# Patient Record
Sex: Male | Born: 1965 | Race: White | Hispanic: No | Marital: Married | Smoking: Never smoker
Health system: Southern US, Community
[De-identification: ages and names within clinical notes are randomized; demographics above are authoritative.]

## PROBLEM LIST (undated history)

## (undated) DIAGNOSIS — E079 Disorder of thyroid, unspecified: Secondary | ICD-10-CM

## (undated) DIAGNOSIS — R7989 Other specified abnormal findings of blood chemistry: Secondary | ICD-10-CM

## (undated) DIAGNOSIS — I10 Essential (primary) hypertension: Secondary | ICD-10-CM

## (undated) DIAGNOSIS — M109 Gout, unspecified: Secondary | ICD-10-CM

---

## 2002-03-27 HISTORY — PX: KNEE ARTHROSCOPY: SUR90

## 2010-12-04 ENCOUNTER — Emergency Department (HOSPITAL_COMMUNITY): Payer: Worker's Compensation

## 2010-12-04 ENCOUNTER — Emergency Department (HOSPITAL_COMMUNITY)
Admission: EM | Admit: 2010-12-04 | Discharge: 2010-12-04 | Disposition: A | Discharge status: Home / Self Care | Payer: Worker's Compensation | Attending: Emergency Medicine | Admitting: Emergency Medicine

## 2010-12-04 DIAGNOSIS — I10 Essential (primary) hypertension: Secondary | ICD-10-CM | POA: Insufficient documentation

## 2010-12-04 DIAGNOSIS — X500XXA Overexertion from strenuous movement or load, initial encounter: Secondary | ICD-10-CM | POA: Insufficient documentation

## 2010-12-04 DIAGNOSIS — S335XXA Sprain of ligaments of lumbar spine, initial encounter: Secondary | ICD-10-CM | POA: Insufficient documentation

## 2010-12-04 DIAGNOSIS — Z794 Long term (current) use of insulin: Secondary | ICD-10-CM | POA: Insufficient documentation

## 2010-12-04 DIAGNOSIS — E119 Type 2 diabetes mellitus without complications: Secondary | ICD-10-CM | POA: Insufficient documentation

## 2010-12-04 DIAGNOSIS — M545 Low back pain, unspecified: Secondary | ICD-10-CM | POA: Insufficient documentation

## 2010-12-04 DIAGNOSIS — M79609 Pain in unspecified limb: Secondary | ICD-10-CM | POA: Insufficient documentation

## 2010-12-04 DIAGNOSIS — Y9289 Other specified places as the place of occurrence of the external cause: Secondary | ICD-10-CM | POA: Insufficient documentation

## 2011-10-13 ENCOUNTER — Emergency Department (INDEPENDENT_AMBULATORY_CARE_PROVIDER_SITE_OTHER)
Admission: EM | Admit: 2011-10-13 | Discharge: 2011-10-13 | Disposition: A | Discharge status: Home / Self Care | Payer: Self-pay | Source: Home / Self Care | Attending: Emergency Medicine | Admitting: Emergency Medicine

## 2011-10-13 ENCOUNTER — Encounter (HOSPITAL_COMMUNITY): Payer: Self-pay | Admitting: Emergency Medicine

## 2011-10-13 DIAGNOSIS — S91339A Puncture wound without foreign body, unspecified foot, initial encounter: Secondary | ICD-10-CM

## 2011-10-13 DIAGNOSIS — S91309A Unspecified open wound, unspecified foot, initial encounter: Secondary | ICD-10-CM

## 2011-10-13 HISTORY — DX: Essential (primary) hypertension: I10

## 2011-10-13 HISTORY — DX: Other specified abnormal findings of blood chemistry: R79.89

## 2011-10-13 HISTORY — DX: Disorder of thyroid, unspecified: E07.9

## 2011-10-13 MED ORDER — TETANUS-DIPHTH-ACELL PERTUSSIS 5-2.5-18.5 LF-MCG/0.5 IM SUSP
0.5000 mL | Freq: Once | INTRAMUSCULAR | Status: AC
  Administered 2011-10-13: 0.5 mL via INTRAMUSCULAR

## 2011-10-13 MED ORDER — TETANUS-DIPHTH-ACELL PERTUSSIS 5-2.5-18.5 LF-MCG/0.5 IM SUSP
INTRAMUSCULAR | Status: AC
  Filled 2011-10-13: qty 0.5

## 2011-10-13 NOTE — ED Provider Notes (Signed)
Medical screening examination/treatment/procedure(s) were performed by non-physician practitioner and as supervising physician I was immediately available for consultation/collaboration.  Leslee Home, M.D.   Reuben Likes, MD 10/13/11 2101

## 2011-10-13 NOTE — ED Provider Notes (Signed)
History     CSN: [id number]  Arrival date & time 10/13/11  1810   First MD Initiated Contact with Patient 10/13/11 1949      Chief Complaint  Patient presents with  . Foot Injury    (Consider location/radiation/quality/duration/timing/severity/associated sxs/prior treatment) HPI Comments: At a restaurant, stepped on a long tack used to attached feet to a chair, going through flip flop and puncturing R foot.  Reports throbbing pain at first, applied ice, no further pain in the area.  Does have peripheral neuropathy from diabetes so usually doesn't have good sensation in his foot.  Also taking keflex, 10 day course started yesterday, to treat B cellulitis pt got when trimming his own toenails.    Patient is a 46 y.o. male presenting with foot injury. The history is provided by the patient.  Foot Injury  The incident occurred 1 to 2 hours ago. Incident location: at a restaurant. Injury mechanism: stepped on a sharp, long tack  The pain is present in the right foot. The quality of the pain is described as throbbing. The pain is moderate. The pain has been improving since onset. Associated symptoms include numbness. He reports no foreign bodies present. Nothing aggravates the symptoms. He has tried ice for the symptoms. The treatment provided significant relief.    Past Medical History  Diagnosis Date  . Diabetes mellitus   . Hypertension   . Thyroid disease   . Low testosterone     Past Surgical History  Procedure Date  . Knee arthroscopy 2004    History reviewed. No pertinent family history.  History  Substance Use Topics  . Smoking status: Never Smoker   . Smokeless tobacco: Current User    Types: Snuff  . Alcohol Use: No      Review of Systems  Constitutional: Negative for fever and chills.  Musculoskeletal: Negative for gait problem.       Foot pain resolved  Skin: Positive for wound.  Neurological: Positive for numbness.       Normally has loss of sensation in  his feet due to neuropathy, no change from usual    Allergies  Review of patient's allergies indicates no known allergies.  Home Medications   Current Outpatient Rx  Name Route Sig Dispense Refill  . AMLODIPINE BESYLATE 10 MG PO TABS Oral Take 10 mg by mouth daily.    . CEPHALEXIN 500 MG PO CAPS Oral Take 500 mg by mouth 3 (three) times daily.    . IMIPRAMINE HCL 50 MG PO TABS Oral Take 50 mg by mouth at bedtime.    . INSULIN ASPART 100 UNIT/ML [place] SOLN Subcutaneous Inject 45 Units into the skin 3 (three) times daily before meals.    Marland Kitchen LEVOTHYROXINE SODIUM 150 MCG PO TABS Oral Take 150 mcg by mouth daily.    Marland Kitchen VICTOZA [place] Subcutaneous Inject into the skin.    Marland Kitchen LOSARTAN POTASSIUM 100 MG PO TABS Oral Take 100 mg by mouth daily.    Marland Kitchen LOVASTATIN 20 MG PO TABS Oral Take 20 mg by mouth.    . METFORMIN HCL 1000 MG PO TABS Oral Take 1,000 mg by mouth 2 (two) times daily with a meal.    . TESTOSTERONE ENANTHATE 200 MG/ML IM OIL Intramuscular Inject 200 mg into the muscle every 14 (fourteen) days. For IM use only      BP 151/84  Pulse 81  Temp 98.2 F (36.8 C) (Oral)  Resp 16  SpO2 94%  Physical Exam  Constitutional: He appears well-developed and well-nourished. No distress.  Pulmonary/Chest: Effort normal.  Skin: Skin is warm and dry.          B great toes with open wounds near lateral toenail, mild surrounding erythema c/w cellulitis.     ED Course  Procedures (including critical care time)  Labs Reviewed - No data to display No results found.   1. Puncture wound of foot       MDM  Pt already on keflex. To monitor puncture wound carefully for signs infection.  Tetanus updated.         Cathlyn Parsons, NP 10/13/11 2029

## 2011-10-13 NOTE — ED Notes (Signed)
Pt stated that he noticed a sharp pain in left foot and looked at his foot and saw that he stepped on the sharp bottom part of a chair that goes on the legs of the chair. Pt stated that he is unsure of how long the tac has been in there but noticed it at 17:30. Pt currently on Keflex for bilateral great toe infection. Pt stated that left foot was throbbing when he arrived but at the moment does not feel any pain.

## 2011-11-01 ENCOUNTER — Encounter (HOSPITAL_COMMUNITY): Payer: Self-pay | Admitting: *Deleted

## 2011-11-01 ENCOUNTER — Emergency Department (HOSPITAL_COMMUNITY): Payer: Commercial Indemnity

## 2011-11-01 ENCOUNTER — Inpatient Hospital Stay (HOSPITAL_COMMUNITY)
Admission: EM | Admit: 2011-11-01 | Discharge: 2011-11-02 | DRG: 310 | Disposition: A | Discharge status: Home / Self Care | Payer: Commercial Indemnity | Attending: Internal Medicine | Admitting: Internal Medicine

## 2011-11-01 DIAGNOSIS — E119 Type 2 diabetes mellitus without complications: Secondary | ICD-10-CM | POA: Diagnosis present

## 2011-11-01 DIAGNOSIS — I4891 Unspecified atrial fibrillation: Principal | ICD-10-CM | POA: Diagnosis present

## 2011-11-01 DIAGNOSIS — E039 Hypothyroidism, unspecified: Secondary | ICD-10-CM

## 2011-11-01 DIAGNOSIS — Z79899 Other long term (current) drug therapy: Secondary | ICD-10-CM

## 2011-11-01 DIAGNOSIS — I10 Essential (primary) hypertension: Secondary | ICD-10-CM | POA: Diagnosis present

## 2011-11-01 DIAGNOSIS — R079 Chest pain, unspecified: Secondary | ICD-10-CM

## 2011-11-01 DIAGNOSIS — E669 Obesity, unspecified: Secondary | ICD-10-CM | POA: Diagnosis present

## 2011-11-01 DIAGNOSIS — M109 Gout, unspecified: Secondary | ICD-10-CM | POA: Diagnosis present

## 2011-11-01 DIAGNOSIS — F17200 Nicotine dependence, unspecified, uncomplicated: Secondary | ICD-10-CM | POA: Diagnosis present

## 2011-11-01 DIAGNOSIS — I517 Cardiomegaly: Secondary | ICD-10-CM

## 2011-11-01 DIAGNOSIS — Z794 Long term (current) use of insulin: Secondary | ICD-10-CM

## 2011-11-01 HISTORY — DX: Gout, unspecified: M10.9

## 2011-11-01 LAB — GLUCOSE, CAPILLARY
Glucose-Capillary: 143 mg/dL — ABNORMAL HIGH (ref 70–99)
Glucose-Capillary: 179 mg/dL — ABNORMAL HIGH (ref 70–99)

## 2011-11-01 LAB — CARDIAC PANEL(CRET KIN+CKTOT+MB+TROPI)
CK, MB: 5.2 ng/mL — ABNORMAL HIGH (ref 0.3–4.0)
Relative Index: 2.7 — ABNORMAL HIGH (ref 0.0–2.5)
Relative Index: 3.3 — ABNORMAL HIGH (ref 0.0–2.5)
Relative Index: 3.7 — ABNORMAL HIGH (ref 0.0–2.5)
Total CK: 250 U/L — ABNORMAL HIGH (ref 7–232)
Troponin I: <0.3 ng/mL (ref <0.30)
Troponin I: <0.3 ng/mL (ref <0.30)

## 2011-11-01 LAB — BASIC METABOLIC PANEL
BUN: 13 mg/dL (ref 6–23)
Creatinine, Ser: 1.06 mg/dL (ref 0.50–1.35)
GFR calc Af Amer: >90 mL/min (ref >90)
GFR calc non Af Amer: 82 mL/min — ABNORMAL LOW (ref >90)
Glucose, Bld: 186 mg/dL — ABNORMAL HIGH (ref 70–99)

## 2011-11-01 LAB — PROTIME-INR: INR: 0.94 (ref 0.00–1.49)

## 2011-11-01 LAB — CBC
HCT: 46.9 % (ref 39.0–52.0)
Hemoglobin: 17.2 g/dL — ABNORMAL HIGH (ref 13.0–17.0)
MCH: 32.3 pg (ref 26.0–34.0)
MCHC: 36.7 g/dL — ABNORMAL HIGH (ref 30.0–36.0)
MCV: 88 fL (ref 78.0–100.0)
RDW: 12.5 % (ref 11.5–15.5)

## 2011-11-01 LAB — TSH: TSH: 5.751 u[IU]/mL — ABNORMAL HIGH (ref 0.350–4.500)

## 2011-11-01 MED ORDER — HEPARIN BOLUS VIA INFUSION
4000.0000 [IU] | Freq: Once | INTRAVENOUS | Status: AC
  Administered 2011-11-01: 4000 [IU] via INTRAVENOUS

## 2011-11-01 MED ORDER — ASPIRIN EC 81 MG PO TBEC
81.0000 mg | DELAYED_RELEASE_TABLET | Freq: Every day | ORAL | Status: DC
  Administered 2011-11-01 – 2011-11-02 (×2): 81 mg via ORAL
  Filled 2011-11-01 (×2): qty 1

## 2011-11-01 MED ORDER — INSULIN ASPART 100 UNIT/ML ~~LOC~~ SOLN
0.0000 [IU] | Freq: Three times a day (TID) | SUBCUTANEOUS | Status: DC
  Administered 2011-11-01: 4 [IU] via SUBCUTANEOUS
  Administered 2011-11-01: 3 [IU] via SUBCUTANEOUS
  Administered 2011-11-01: 4 [IU] via SUBCUTANEOUS

## 2011-11-01 MED ORDER — ONDANSETRON HCL 4 MG/2ML IJ SOLN: 4.0000 mg | Freq: Four times a day (QID) | INTRAMUSCULAR | Status: DC | PRN

## 2011-11-01 MED ORDER — LOSARTAN POTASSIUM 50 MG PO TABS
100.0000 mg | ORAL_TABLET | Freq: Every day | ORAL | Status: DC
Start: 2011-11-01 — End: 2011-11-02
  Administered 2011-11-01 – 2011-11-02 (×2): 100 mg via ORAL
  Filled 2011-11-01 (×2): qty 2

## 2011-11-01 MED ORDER — SODIUM CHLORIDE 0.9 % IJ SOLN: 3.0000 mL | Freq: Two times a day (BID) | INTRAMUSCULAR | Status: DC

## 2011-11-01 MED ORDER — HEPARIN (PORCINE) IN NACL 100-0.45 UNIT/ML-% IJ SOLN
1700.0000 [IU]/h | INTRAMUSCULAR | Status: DC
  Administered 2011-11-01: 1000 [IU]/h via INTRAVENOUS
  Administered 2011-11-01: 1700 [IU]/h via INTRAVENOUS
  Filled 2011-11-01 (×2): qty 250

## 2011-11-01 MED ORDER — SIMVASTATIN 10 MG PO TABS
10.0000 mg | ORAL_TABLET | Freq: Every day | ORAL | Status: DC
  Administered 2011-11-01: 10 mg via ORAL
  Filled 2011-11-01 (×2): qty 1

## 2011-11-01 MED ORDER — ASPIRIN 81 MG PO CHEW
324.0000 mg | CHEWABLE_TABLET | Freq: Once | ORAL | Status: AC
  Administered 2011-11-01: 324 mg via ORAL
  Filled 2011-11-01: qty 4

## 2011-11-01 MED ORDER — AMLODIPINE BESYLATE 10 MG PO TABS
10.0000 mg | ORAL_TABLET | Freq: Every day | ORAL | Status: DC
  Administered 2011-11-01 – 2011-11-02 (×2): 10 mg via ORAL
  Filled 2011-11-01 (×2): qty 1

## 2011-11-01 MED ORDER — INSULIN ASPART 100 UNIT/ML ~~LOC~~ SOLN
45.0000 [IU] | Freq: Three times a day (TID) | SUBCUTANEOUS | Status: DC
  Administered 2011-11-01 – 2011-11-02 (×4): 45 [IU] via SUBCUTANEOUS

## 2011-11-01 MED ORDER — INSULIN ASPART 100 UNIT/ML ~~LOC~~ SOLN: 0.0000 [IU] | Freq: Every day | SUBCUTANEOUS | Status: DC

## 2011-11-01 MED ORDER — IMIPRAMINE HCL 50 MG PO TABS
50.0000 mg | ORAL_TABLET | Freq: Every day | ORAL | Status: DC
  Administered 2011-11-01: 50 mg via ORAL
  Filled 2011-11-01 (×2): qty 1

## 2011-11-01 MED ORDER — HEPARIN BOLUS VIA INFUSION
3000.0000 [IU] | Freq: Once | INTRAVENOUS | Status: AC
  Administered 2011-11-01: 3000 [IU] via INTRAVENOUS
  Filled 2011-11-01: qty 3000

## 2011-11-01 MED ORDER — MORPHINE SULFATE 2 MG/ML IJ SOLN: 2.0000 mg | INTRAMUSCULAR | Status: DC | PRN

## 2011-11-01 MED ORDER — HEPARIN (PORCINE) IN NACL 100-0.45 UNIT/ML-% IJ SOLN
2950.0000 [IU]/h | INTRAMUSCULAR | Status: DC
  Administered 2011-11-01 (×2): 2050 [IU]/h via INTRAVENOUS
  Administered 2011-11-02: 2500 [IU]/h via INTRAVENOUS
  Filled 2011-11-01 (×4): qty 250

## 2011-11-01 MED ORDER — METOPROLOL TARTRATE 25 MG PO TABS
25.0000 mg | ORAL_TABLET | Freq: Two times a day (BID) | ORAL | Status: DC
  Administered 2011-11-01 – 2011-11-02 (×3): 25 mg via ORAL
  Filled 2011-11-01 (×5): qty 1

## 2011-11-01 MED ORDER — SODIUM CHLORIDE 0.9 % IV SOLN
INTRAVENOUS | Status: DC
  Administered 2011-11-01 – 2011-11-02 (×2): via INTRAVENOUS

## 2011-11-01 MED ORDER — METFORMIN HCL 500 MG PO TABS
1000.0000 mg | ORAL_TABLET | Freq: Two times a day (BID) | ORAL | Status: DC
  Administered 2011-11-01 – 2011-11-02 (×3): 1000 mg via ORAL
  Filled 2011-11-01 (×5): qty 2

## 2011-11-01 MED ORDER — ONDANSETRON HCL 4 MG PO TABS: 4.0000 mg | ORAL_TABLET | Freq: Four times a day (QID) | ORAL | Status: DC | PRN

## 2011-11-01 MED ORDER — LEVOTHYROXINE SODIUM 150 MCG PO TABS
150.0000 ug | ORAL_TABLET | Freq: Every day | ORAL | Status: DC
  Administered 2011-11-01 – 2011-11-02 (×2): 150 ug via ORAL
  Filled 2011-11-01 (×3): qty 1

## 2011-11-01 NOTE — ED Notes (Signed)
REPORT GIVEN TO FLOOR NURSE, TRANSPORTED BY FLOAT NURSE IN STABLE CONDITION , NO PAIN , RESPIRATIONS UNLABORED , HEPARIN DRIP INFUSING , IV SITE UNREMARKABLE.

## 2011-11-01 NOTE — Progress Notes (Signed)
ANTICOAGULATION CONSULT NOTE - Initial Consult  Pharmacy Consult for heparin Indication: atrial fibrillation  No Known Allergies  Patient Measurements: Height: 6\' 3"  (190.5 cm) Weight: 292 lb 8 oz (132.677 kg) IBW/kg (Calculated) : 84.5  Heparin Dosing Weight: 114 kg   Vital Signs: Temp: 98.1 F (36.7 C) (08/07 0535) Temp src: Oral (08/07 0535) BP: 170/92 mmHg (08/07 0535) Pulse Rate: 89  (08/07 0535)  Labs:  Alvira Philips 11/01/11 0208  HGB 17.2*  HCT 46.9  PLT 292  APTT --  LABPROT --  INR --  HEPARINUNFRC --  CREATININE 1.06  CKTOTAL --  CKMB --  TROPONINI --    Estimated Creatinine Clearance: 126.5 ml/min (by C-G formula based on Cr of 1.06).   Medical History: Past Medical History  Diagnosis Date  . Diabetes mellitus   . Hypertension   . Thyroid disease   . Low testosterone   . Gout   3  Medications:  Scheduled:    . amLODipine  10 mg Oral Daily  . aspirin  324 mg Oral Once  . aspirin EC  81 mg Oral Daily  . heparin  4,000 Units Intravenous Once  . imipramine  50 mg Oral QHS  . insulin aspart  0-20 Units Subcutaneous TID WC  . insulin aspart  0-5 Units Subcutaneous QHS  . insulin aspart  45 Units Subcutaneous TID AC  . levothyroxine  150 mcg Oral QAC breakfast  . losartan  100 mg Oral Daily  . metFORMIN  1,000 mg Oral BID WC  . simvastatin  10 mg Oral q1800  . sodium chloride  3 mL Intravenous Q12H    Assessment: 46 yo male admitted with atrial fibrillation. Heparin started in ED and is infusing at 1000 units/hr.   Goal of Therapy:  Heparin level 0.3-0.7 units/ml Monitor platelets by anticoagulation protocol: Yes   Plan:  1. Increase IV heparin to 1700 units/hr.  2. Heparin level in 6 hours.  3. Daily CBC, heparin level  Emeline Gins 11/01/2011,7:28 AM

## 2011-11-01 NOTE — Progress Notes (Signed)
Patient seen and examined by me- see H&P by Dr. Conley Rolls from this AM.  Cardiology consulted for new onset a fib. Will start metoprolol for better BP control.  Rate does not seem to be an issue right now.  Marlin Canary

## 2011-11-01 NOTE — Progress Notes (Signed)
Heparin bolus given of 3000 units and rate increased to 20.5 ml/hr.

## 2011-11-01 NOTE — ED Notes (Signed)
PHARMACY NOTIFIED ON PT'S HEPARIN IV DRIP ORDER.

## 2011-11-01 NOTE — ED Provider Notes (Signed)
History     CSN: [id number]  Arrival date & time 11/01/11  0200   First MD Initiated Contact with Patient 11/01/11 0246      Chief Complaint  Patient presents with  . Chest Pain    (Consider location/radiation/quality/duration/timing/severity/associated sxs/prior treatment) HPI History provided by patient. Had some are not dizziness at work yesterday. Tonight developed shortness of breath with substernal chest pain and tightness. Symptoms relieved when he received oxygen in the emergency room. He is currently asymptomatic. No known aggravating or alleviating symptoms otherwise. Has history of diabetes hypertension thyroid disease - no recent change in medications. Pain was 3/10 in severity at its worse. No leg pain or leg swelling. No fevers or chills. No recent illness. Drinks caffeine daily. Nonsmoker. Occasional alcohol use. No history of atrial fibrillation.   Past Medical History  Diagnosis Date  . Diabetes mellitus   . Hypertension   . Thyroid disease   . Low testosterone   . Gout     Past Surgical History  Procedure Date  . Knee arthroscopy 2004    History reviewed. No pertinent family history.  History  Substance Use Topics  . Smoking status: Never Smoker   . Smokeless tobacco: Current User    Types: Snuff  . Alcohol Use: Yes     occasionally      Review of Systems  Constitutional: Negative for fever and chills.  HENT: Negative for neck pain and neck stiffness.   Eyes: Negative for pain.  Respiratory: Positive for shortness of breath.   Cardiovascular: Positive for chest pain.  Gastrointestinal: Negative for abdominal pain.  Genitourinary: Negative for dysuria.  Musculoskeletal: Negative for back pain.  Skin: Negative for rash.  Neurological: Negative for headaches.  All other systems reviewed and are negative.    Allergies  Review of patient's allergies indicates no known allergies.  Home Medications   Current Outpatient Rx  Name Route Sig  Dispense Refill  . AMLODIPINE BESYLATE 10 MG PO TABS Oral Take 10 mg by mouth daily.    . IMIPRAMINE HCL 50 MG PO TABS Oral Take 50 mg by mouth at bedtime.     . INSULIN ASPART 100 UNIT/ML [place] SOLN Subcutaneous Inject 45 Units into the skin 3 (three) times daily before meals.     Marland Kitchen LEVOTHYROXINE SODIUM 150 MCG PO TABS Oral Take 150 mcg by mouth daily.    Marland Kitchen VICTOZA [place] Subcutaneous Inject into the skin every morning.     Marland Kitchen LOSARTAN POTASSIUM 100 MG PO TABS Oral Take 100 mg by mouth daily.    Marland Kitchen LOVASTATIN 20 MG PO TABS Oral Take 20 mg by mouth.    . METFORMIN HCL 1000 MG PO TABS Oral Take 1,000 mg by mouth 2 (two) times daily with a meal.    . TESTOSTERONE ENANTHATE 200 MG/ML IM OIL Intramuscular Inject 200 mg into the muscle every 14 (fourteen) days. For IM use only      BP 151/80  Pulse 93  Temp 98.7 F (37.1 C) (Oral)  Resp 14  SpO2 96%  Physical Exam  Constitutional: He is oriented to person, place, and time. He appears well-developed and well-nourished.  HENT:  Head: Normocephalic and atraumatic.  Eyes: Conjunctivae and EOM are normal. Pupils are equal, round, and reactive to light.  Neck: Trachea normal. Neck supple. No thyromegaly present.  Cardiovascular: S1 normal, S2 normal and normal pulses.     No systolic murmur is present   No diastolic murmur is present  Pulses:      Radial pulses are 2+ on the right side, and 2+ on the left side.       Irregularly irregular  Pulmonary/Chest: Effort normal and breath sounds normal. He has no wheezes. He has no rhonchi. He has no rales. He exhibits no tenderness.  Abdominal: Soft. Normal appearance and bowel sounds are normal. There is no tenderness. There is no CVA tenderness and negative Murphy's sign.  Musculoskeletal:       BLE:s Calves nontender, no cords or erythema, negative Homans sign  Neurological: He is alert and oriented to person, place, and time. He has normal strength. No cranial nerve deficit or sensory deficit. GCS eye  subscore is 4. GCS verbal subscore is 5. GCS motor subscore is 6.  Skin: Skin is warm and dry. No rash noted. He is not diaphoretic.  Psychiatric: His speech is normal.       Cooperative and appropriate    ED Course  Procedures (including critical care time)  Results for orders placed during the hospital encounter of 11/01/11  CBC      Component Value Range   WBC 11.2 (*) 4.0 - 10.5 K/uL   RBC 5.33  4.22 - 5.81 MIL/uL   Hemoglobin 17.2 (*) 13.0 - 17.0 g/dL   HCT 16.1  09.6 - 04.5 %   MCV 88.0  78.0 - 100.0 fL   MCH 32.3  26.0 - 34.0 pg   MCHC 36.7 (*) 30.0 - 36.0 g/dL   RDW 40.9  81.1 - 91.4 %   Platelets 292  150 - 400 K/uL  BASIC METABOLIC PANEL      Component Value Range   Sodium 140  135 - 145 mEq/L   Potassium 3.8  3.5 - 5.1 mEq/L   Chloride 99  96 - 112 mEq/L   CO2 28  19 - 32 mEq/L   Glucose, Bld 186 (*) 70 - 99 mg/dL   BUN 13  6 - 23 mg/dL   Creatinine, Ser 7.82  0.50 - 1.35 mg/dL   Calcium 95.6  8.4 - 21.3 mg/dL   GFR calc non Af Amer 82 (*) >90 mL/min   GFR calc Af Amer >90  >90 mL/min  POCT I-STAT TROPONIN I      Component Value Range   Troponin i, poc 0.00  0.00 - 0.08 ng/mL   Comment 3            Dg Chest 2 View  11/01/2011  *RADIOLOGY REPORT*  Clinical Data: Shortness of breath.  Chest pain.  CHEST - 2 VIEW  Comparison: None.  Findings: Shallow inspiration. The heart size and pulmonary vascularity are normal. The lungs appear clear and expanded without focal air space disease or consolidation. No blunting of the costophrenic angles.  No pneumothorax.  Mediastinal contours are intact.  IMPRESSION: No evidence of active pulmonary disease.  Original Report Authenticated By: Marlon Pel, M.D.    CP/ SOB with new onset afib.   Date: 11/01/2011  Rate: 97  Rhythm: atrial fibrillation  QRS Axis: normal  Intervals: normal  ST/T Wave abnormalities: nonspecific ST changes  Conduction Disutrbances:none  Narrative Interpretation:   Old EKG Reviewed: none  available  Aspirin provided. Serial evaluations remains pain-free.  4:10 AM d/w Dr Lurline Idol on call for CAR, recs MED admit and CAR c/s in am  4:25 AM d/w DR [place] Siren, will admit.  Plan heparin and tele. MDM   Nursing notes reviewed. Vital signs reviewed. Labs EKG  and imaging reviewed as above. Cardiology and medicine consult for admission.        Sunnie Nielsen, MD 11/01/11 [phone]

## 2011-11-01 NOTE — Progress Notes (Signed)
Critical value called from lab-CK MB 6.8, no CP at this time, Dr. Betti Cruz notified, no new orders given. Will cont. To monitor patient closely. Ricky Torres, [name]

## 2011-11-01 NOTE — Progress Notes (Signed)
*  PRELIMINARY RESULTS* Echocardiogram 2D Echocardiogram has been performed.  Ricky Torres 11/01/2011, 3:31 PM

## 2011-11-01 NOTE — Progress Notes (Signed)
ANTICOAGULATION CONSULT NOTE - Follow Up Consult  Pharmacy Consult for heparin Indication: atrial fibrillation  Labs:  Basename 11/01/11 2215 11/01/11 2214 11/01/11 1338 11/01/11 1334 11/01/11 0630 11/01/11 0208  HGB -- -- -- -- -- 17.2*  HCT -- -- -- -- -- 46.9  PLT -- -- -- -- -- 292  APTT -- -- -- -- -- --  LABPROT -- -- -- 12.8 -- --  INR -- -- -- 0.94 -- --  HEPARINUNFRC -- <0.10* -- <0.10* -- --  CREATININE -- -- -- -- -- 1.06  CKTOTAL 140 -- 183 -- 250* --  CKMB 5.2* -- 6.1* -- 6.8* --  TROPONINI <0.30 -- <0.30 -- <0.30 --    Assessment: 46yo male remains undetectable on heparin despite bolus and significant rate increase.  Goal of Therapy:  Heparin level 0.3-0.7 units/ml   Plan:  Will rebolus heparin with 3000 units x1 and increase gtt by 4 units/kg(ABW)/hr to 2500 units/hr and check level in 6hr.  Colleen Can PharmD BCPS 11/01/2011,11:49 PM

## 2011-11-01 NOTE — ED Notes (Signed)
Pt c/o CP since 11 pm this evening.  States earlier in the day he had several dizzy spells without LOC.  Some SOB, denies n/v, dizziness.

## 2011-11-01 NOTE — Consult Note (Addendum)
Reason for Consult:paroxysmal atrial fib Referring Physician: Dr. Benjamine Mola PCP: Dr. Shona Simpson MItchell  Ricky Torres is an 46 y.o. male.  HPI: This pleasant middle-aged man with history of diabetes and high blood pressure was admitted with atrial fibrillation and chest pressure. Yesterday at work he had several dizzy spells which may have signaled the onset of the AF. This am had chest tightness and came to ER.  No evidence of acute MI  Past Medical History  Diagnosis Date  . Diabetes mellitus   . Hypertension   . Thyroid disease   . Low testosterone   . Gout     Past Surgical History  Procedure Date  . Knee arthroscopy 2004    History reviewed. No pertinent family history.  Social History:  reports that he has never smoked. His smokeless tobacco use includes Snuff. He reports no alcohol for 19 years. He reports that he does not use illicit drugs.  Allergies: No Known Allergies  Medications:  Scheduled:   . amLODipine  10 mg Oral Daily  . aspirin  324 mg Oral Once  . aspirin EC  81 mg Oral Daily  . heparin  3,000 Units Intravenous Once  . heparin  4,000 Units Intravenous Once  . imipramine  50 mg Oral QHS  . insulin aspart  0-20 Units Subcutaneous TID WC  . insulin aspart  0-5 Units Subcutaneous QHS  . insulin aspart  45 Units Subcutaneous TID AC  . levothyroxine  150 mcg Oral QAC breakfast  . losartan  100 mg Oral Daily  . metFORMIN  1,000 mg Oral BID WC  . metoprolol tartrate  25 mg Oral BID  . simvastatin  10 mg Oral q1800  . sodium chloride  3 mL Intravenous Q12H    Results for orders placed during the hospital encounter of 11/01/11 (from the past 48 hour(s))  CBC     Status: Abnormal   Collection Time   11/01/11  2:08 AM      Component Value Range Comment   WBC 11.2 (*) 4.0 - 10.5 K/uL    RBC 5.33  4.22 - 5.81 MIL/uL    Hemoglobin 17.2 (*) 13.0 - 17.0 g/dL    HCT 16.1  09.6 - 04.5 %    MCV 88.0  78.0 - 100.0 fL    MCH 32.3  26.0 - 34.0 pg    MCHC 36.7 (*) 30.0 -  36.0 g/dL    RDW 40.9  81.1 - 91.4 %    Platelets 292  150 - 400 K/uL   BASIC METABOLIC PANEL     Status: Abnormal   Collection Time   11/01/11  2:08 AM      Component Value Range Comment   Sodium 140  135 - 145 mEq/L    Potassium 3.8  3.5 - 5.1 mEq/L    Chloride 99  96 - 112 mEq/L    CO2 28  19 - 32 mEq/L    Glucose, Bld 186 (*) 70 - 99 mg/dL    BUN 13  6 - 23 mg/dL    Creatinine, Ser 7.82  0.50 - 1.35 mg/dL    Calcium 95.6  8.4 - 10.5 mg/dL    GFR calc non Af Amer 82 (*) >90 mL/min    GFR calc Af Amer >90  >90 mL/min   POCT I-STAT TROPONIN I     Status: Normal   Collection Time   11/01/11  2:17 AM      Component Value Range  Comment   Troponin i, poc 0.00  0.00 - 0.08 ng/mL    Comment 3            TSH     Status: Abnormal   Collection Time   11/01/11  6:30 AM      Component Value Range Comment   TSH 5.751 (*) 0.350 - 4.500 uIU/mL   CARDIAC PANEL(CRET KIN+CKTOT+MB+TROPI)     Status: Abnormal   Collection Time   11/01/11  6:30 AM      Component Value Range Comment   Total CK 250 (*) 7 - 232 U/L    CK, MB 6.8 (*) 0.3 - 4.0 ng/mL    Troponin I <0.30  <0.30 ng/mL    Relative Index 2.7 (*) 0.0 - 2.5   GLUCOSE, CAPILLARY     Status: Abnormal   Collection Time   11/01/11  7:07 AM      Component Value Range Comment   Glucose-Capillary 194 (*) 70 - 99 mg/dL   GLUCOSE, CAPILLARY     Status: Abnormal   Collection Time   11/01/11 11:40 AM      Component Value Range Comment   Glucose-Capillary 143 (*) 70 - 99 mg/dL    Comment 1 Documented in Chart      Comment 2 Notify RN     HEPARIN LEVEL (UNFRACTIONATED)     Status: Abnormal   Collection Time   11/01/11  1:34 PM      Component Value Range Comment   Heparin Unfractionated <0.10 (*) 0.30 - 0.70 IU/mL   PROTIME-INR     Status: Normal   Collection Time   11/01/11  1:34 PM      Component Value Range Comment   Prothrombin Time 12.8  11.6 - 15.2 seconds    INR 0.94  0.00 - 1.49   CARDIAC PANEL(CRET KIN+CKTOT+MB+TROPI)     Status: Abnormal    Collection Time   11/01/11  1:38 PM      Component Value Range Comment   Total CK 183  7 - 232 U/L    CK, MB 6.1 (*) 0.3 - 4.0 ng/mL CRITICAL VALUE NOTED.  VALUE IS CONSISTENT WITH PREVIOUSLY REPORTED AND CALLED VALUE.   Troponin I <0.30  <0.30 ng/mL    Relative Index 3.3 (*) 0.0 - 2.5   GLUCOSE, CAPILLARY     Status: Abnormal   Collection Time   11/01/11  4:58 PM      Component Value Range Comment   Glucose-Capillary 179 (*) 70 - 99 mg/dL    Comment 1 Documented in Chart      Comment 2 Notify RN       Dg Chest 2 View  11/01/2011  *RADIOLOGY REPORT*  Clinical Data: Shortness of breath.  Chest pain.  CHEST - 2 VIEW  Comparison: None.  Findings: Shallow inspiration. The heart size and pulmonary vascularity are normal. The lungs appear clear and expanded without focal air space disease or consolidation. No blunting of the costophrenic angles.  No pneumothorax.  Mediastinal contours are intact.  IMPRESSION: No evidence of active pulmonary disease.  Original Report Authenticated By: Marlon Pel, M.D.    ROS: No history of GI bleed.   Blood pressure 125/74, pulse 72, temperature 97.9 F (36.6 C), temperature source Oral, resp. rate 18, height 6\' 3"  (1.905 m), weight 292 lb 8 oz (132.677 kg), SpO2 95.00%. The patient appears to be in no distress.  Head and neck exam reveals that the pupils are equal  and reactive.  The extraocular movements are full.  There is no scleral icterus.  Mouth and pharynx are benign.  No lymphadenopathy.  No carotid bruits.  The jugular venous pressure is normal.  Thyroid is not enlarged or tender.  Chest is clear to percussion and auscultation.  No rales or rhonchi.  Expansion of the chest is symmetrical.  Heart reveals no abnormal lift or heave.  First and second heart sounds are normal.  There is no murmur gallop rub or click. He has been back in NSR since 1300 today.  The abdomen is soft and nontender.  Bowel sounds are normoactive.  There is no  hepatosplenomegaly or mass.  There are no abdominal bruits.  Extremities reveal no phlebitis or edema.  Pedal pulses are good.  There is no cyanosis or clubbing.  Neurologic exam is normal strength and no lateralizing weakness.  No sensory deficits.  Integument reveals no rash            Result Narrative     Patrcia Dolly [facility]* *[facility]* [address] [place], [place] [address] [phone]  ------------------------------------------------------------ Transthoracic Echocardiography  (Report amended )  Patient: Ricky, Torres MR #: [id number] Study Date: 11/01/2011 Gender: M Age: 8 Height: 190.5cm Weight: 132.7kg BSA: 2.32m^2 Pt. Status: Room: 2011  PERFORMING [place], [facility] [place] Siren SONOGRAPHER [place], [place] cc:  ------------------------------------------------------------ LV EF: 55% - 60%  ------------------------------------------------------------ Indications: Atrial fibrillation - 427.31.  ------------------------------------------------------------ History: PMH: chest pain, hypothyroidism Risk factors: Hypertension. Diabetes mellitus.  ------------------------------------------------------------ Study Conclusions  - Left ventricle: The cavity size was normal. Wall thickness was increased in a pattern of mild LVH. Systolic function was normal. The estimated ejection fraction was in the range of 55% to 60%. - Left atrium: The atrium was mildly dilated. - Atrial septum: No defect or patent foramen ovale was identified. Transthoracic echocardiography. M-mode, complete 2D, spectral Doppler, and color Doppler. Height: Height: 190.5cm. Height: 75in. Weight: Weight: 132.7kg. Weight: 292lb. Body mass index: BMI: 36.6kg/m^2. Body surface area: BSA: 2.91m^2. Blood pressure: 125/74. Patient status: Inpatient. Location: Bedside.    EKG on admission shows Atrial fib with rapid VR and no ischemic STT  changes.  Assessment/Plan: 1. Paroxysmal atrial fibrillation--CHADSS 2 for DM and BP2 2. Chest pain with normal enzymes secondary to rapid rate.  Rec: Long term anticoagulation with warfarin or xarelto.  I suggested xarelto if he can afford it.        Continue low dose BB to help prevent future episodes of AF  Cassell Clement 11/01/2011, 6:10 PM

## 2011-11-01 NOTE — Progress Notes (Addendum)
ANTICOAGULATION CONSULT NOTE - Initial Consult  Pharmacy Consult for heparin Indication: atrial fibrillation  No Known Allergies  Patient Measurements: Height: 6\' 3"  (190.5 cm) Weight: 292 lb 8 oz (132.677 kg) IBW/kg (Calculated) : 84.5  Heparin Dosing Weight: 114 kg  Vital Signs: Temp: 97.9 F (36.6 C) (08/07 1427) Temp src: Oral (08/07 1427) BP: 125/74 mmHg (08/07 1427) Pulse Rate: 72  (08/07 1427)  Labs:  Basename 11/01/11 1334 11/01/11 0630 11/01/11 0208  HGB -- -- 17.2*  HCT -- -- 46.9  PLT -- -- 292  APTT -- -- --  LABPROT 12.8 -- --  INR 0.94 -- --  HEPARINUNFRC <0.10* -- --  CREATININE -- -- 1.06  CKTOTAL -- 250* --  CKMB -- 6.8* --  TROPONINI -- <0.30 --    Estimated Creatinine Clearance: 126.5 ml/min (by C-G formula based on Cr of 1.06).   Medical History: Past Medical History  Diagnosis Date  . Diabetes mellitus   . Hypertension   . Thyroid disease   . Low testosterone   . Gout     Medications:  Scheduled:    . amLODipine  10 mg Oral Daily  . aspirin  324 mg Oral Once  . aspirin EC  81 mg Oral Daily  . heparin  4,000 Units Intravenous Once  . imipramine  50 mg Oral QHS  . insulin aspart  0-20 Units Subcutaneous TID WC  . insulin aspart  0-5 Units Subcutaneous QHS  . insulin aspart  45 Units Subcutaneous TID AC  . levothyroxine  150 mcg Oral QAC breakfast  . losartan  100 mg Oral Daily  . metFORMIN  1,000 mg Oral BID WC  . metoprolol tartrate  25 mg Oral BID  . simvastatin  10 mg Oral q1800  . sodium chloride  3 mL Intravenous Q12H   Infusions:    . sodium chloride 100 mL/hr at 11/01/11 0630  . heparin 1,700 Units/hr (11/01/11 0759)    Assessment: 46 yo male with atrial fibrillation is currently on subtherapeutic heparin.  Heparin level was < 0.1. RN indicated no interruption or problems with the infusion. Goal of Therapy:  Heparin level 0.3-0.7 units/ml Monitor platelets by anticoagulation protocol: Yes   Plan:  1) Heparin  3000 units iv bolus x1, then increase heparin drip to 2050 units/hr. 2) Check a 6hr heparin level after drip rate is changed.  Day Greb, Tsz-Yin 11/01/2011,2:35 PM

## 2011-11-01 NOTE — H&P (Signed)
[facility] History and Physical  Ricky Torres JYN:[id number] DOB: 1966-02-28    PCP:   Followed by endocrinologist  Chief Complaint: Chest pain.  HPI: Ricky Torres is an 46 y.o. male with history of hypertension, diabetes, hypothyroidism on supplement, hypo-testosterone on supplement, history of prior alcohol use but sober for 19 years, presents to the emergency room feeling tightness in his chest and lightheadedness. He was found to be in atrial fibrillation with controlled ventricular rate. Further evaluation included negative cardiac markers point-of-care, and chest x-ray was clear. In the emergency room he is pain-free. He is not having any tachycardia nor having any shortness of breath. He denied any leg swelling or calf tenderness, no recent surgery, and no distant travel. Hospitalist was asked to admit him for further evaluation and treatment of his new onset atrial fibrillation with controlled ventricular rate.  He has no history of congestive heart failure, prior stroke or TIA (CHADS 2). He has no history of GI bleed or any contraindication to anticoagulation.    Rewiew of Systems:  Constitutional: Negative for malaise, fever and chills. No significant weight loss or weight gain Eyes: Negative for eye pain, redness and discharge, diplopia, visual changes, or flashes of light. ENMT: Negative for ear pain, hoarseness, nasal congestion, sinus pressure and sore throat. No headaches; tinnitus, drooling, or problem swallowing. Cardiovascular: Negative for diaphoresis, dyspnea and peripheral edema. ; No orthopnea, PND Respiratory: Negative for cough, hemoptysis, wheezing and stridor. No pleuritic chestpain. Gastrointestinal: Negative for nausea, vomiting, diarrhea, constipation, abdominal pain, melena, blood in stool, hematemesis, jaundice and rectal bleeding.    Genitourinary: Negative for frequency, dysuria, incontinence,flank pain and hematuria; Musculoskeletal: Negative for back pain and  neck pain. Negative for swelling and trauma.;  Skin: . Negative for pruritus, rash, abrasions, bruising and skin lesion.; ulcerations Neuro: Negative for headache, lightheadedness and neck stiffness. Negative for weakness, altered level of consciousness , altered mental status, extremity weakness, burning feet, involuntary movement, seizure and syncope.  Psych: negative for anxiety, depression, insomnia, tearfulness, panic attacks, hallucinations, paranoia, suicidal or homicidal ideation    Past Medical History  Diagnosis Date  . Diabetes mellitus   . Hypertension   . Thyroid disease   . Low testosterone   . Gout     Past Surgical History  Procedure Date  . Knee arthroscopy 2004    Medications:  HOME MEDS: Prior to Admission medications   Medication Sig Start Date End Date Taking? Authorizing Provider  amLODipine (NORVASC) 10 MG tablet Take 10 mg by mouth daily.   Yes Historical Provider, MD  imipramine (TOFRANIL) 50 MG tablet Take 50 mg by mouth at bedtime.    Yes Historical Provider, MD  insulin aspart (NOVOLOG) 100 UNIT/ML injection Inject 45 Units into the skin 3 (three) times daily before meals.    Yes Historical Provider, MD  levothyroxine (SYNTHROID, LEVOTHROID) 150 MCG tablet Take 150 mcg by mouth daily.   Yes Historical Provider, MD  Liraglutide (VICTOZA [place]) Inject into the skin every morning.    Yes Historical Provider, MD  losartan (COZAAR) 100 MG tablet Take 100 mg by mouth daily.   Yes Historical Provider, MD  lovastatin (MEVACOR) 20 MG tablet Take 20 mg by mouth.   Yes Historical Provider, MD  metFORMIN (GLUCOPHAGE) 1000 MG tablet Take 1,000 mg by mouth 2 (two) times daily with a meal.   Yes Historical Provider, MD  testosterone enanthate (DELATESTRYL) 200 MG/ML injection Inject 200 mg into the muscle every 14 (fourteen) days. For IM use  only   Yes Historical Provider, MD     Allergies:  No Known Allergies  Social History:   reports that he has never smoked. His  smokeless tobacco use includes Snuff. He reports that he drinks alcohol. He reports that he does not use illicit drugs.  Family History: History reviewed. No pertinent family history.   Physical Exam: Filed Vitals:   11/01/11 0246 11/01/11 0354 11/01/11 0438 11/01/11 0535  BP: 156/86 151/80  170/92  Pulse: 93 93  89  Temp:    98.1 F (36.7 C)  TempSrc:    Oral  Resp: 19 14  18   Height:   6\' 3"  (1.905 m)   Weight:   133.811 kg (295 lb) 132.677 kg (292 lb 8 oz)  SpO2: 98% 96%  92%   Blood pressure 170/92, pulse 89, temperature 98.1 F (36.7 C), temperature source Oral, resp. rate 18, height 6\' 3"  (1.905 m), weight 132.677 kg (292 lb 8 oz), SpO2 92.00%.  GEN:  Pleasant obese  patient lying in the stretcher in no acute distress; cooperative with exam. PSYCH:  alert and oriented x4; does not appear anxious or depressed; affect is appropriate. HEENT: Mucous membranes pink and anicteric; PERRLA; EOM intact; no cervical lymphadenopathy nor thyromegaly or carotid bruit; no JVD; There were no stridor. Neck is very supple. Breasts:: Not examined CHEST WALL: No tenderness CHEST: Normal respiration, clear to auscultation bilaterally.  HEART: Regular rate  but irregular rhythm.  There are no murmur, rub, or gallops.   BACK: No kyphosis or scoliosis; no CVA tenderness ABDOMEN: soft and non-tender; no masses, no organomegaly, normal abdominal bowel sounds; no pannus; no intertriginous candida. There is no rebound and no distention. Rectal Exam: Not done EXTREMITIES: No bone or joint deformity; age-appropriate arthropathy of the hands and knees; no edema; no ulcerations.  There is no calf tenderness. Genitalia: not examined PULSES: 2+ and symmetric SKIN: Normal hydration no rash or ulceration CNS: Cranial nerves 2-12 grossly intact no focal lateralizing neurologic deficit.  Speech is fluent; uvula elevated with phonation, facial symmetry and tongue midline. DTR are normal bilaterally, cerebella  exam is intact, barbinski is negative and strengths are equaled bilaterally.  No sensory loss.   Labs on Admission:  Basic Metabolic Panel:  Lab 11/01/11 4098  NA 140  K 3.8  CL 99  CO2 28  GLUCOSE 186*  BUN 13  CREATININE 1.06  CALCIUM 10.4  MG --  PHOS --   Liver Function Tests: No results found for this basename: AST:5,ALT:5,ALKPHOS:5,BILITOT:5,PROT:5,ALBUMIN:5 in the last 168 hours No results found for this basename: LIPASE:5,AMYLASE:5 in the last 168 hours No results found for this basename: AMMONIA:5 in the last 168 hours CBC:  Lab 11/01/11 0208  WBC 11.2*  NEUTROABS --  HGB 17.2*  HCT 46.9  MCV 88.0  PLT 292   Cardiac Enzymes: No results found for this basename: CKTOTAL:5,CKMB:5,CKMBINDEX:5,TROPONINI:5 in the last 168 hours  CBG:  Lab 11/01/11 0707  GLUCAP 194*     Radiological Exams on Admission: Dg Chest 2 View  11/01/2011  *RADIOLOGY REPORT*  Clinical Data: Shortness of breath.  Chest pain.  CHEST - 2 VIEW  Comparison: None.  Findings: Shallow inspiration. The heart size and pulmonary vascularity are normal. The lungs appear clear and expanded without focal air space disease or consolidation. No blunting of the costophrenic angles.  No pneumothorax.  Mediastinal contours are intact.  IMPRESSION: No evidence of active pulmonary disease.  Original Report Authenticated By: Marlon Pel, M.D.  EKG: Independently reviewed.    Assessment/Plan Present on Admission:  .HTN (hypertension) .Hypothyroidism New onset atrial fibrillation Diabetes   PLAN:  We'll admit him to telemetry, anticoagulate him fully since I cannot be certain that his atrial fibrillation was less than 48 hours.  Would check his thyroid level as over supplementation can certainly precipitate atrial fibrillation. He will need a cardiac echo to evaluate chamber size and any presence of large thrombus. Please consult cardiology if he does not convert spontaneously.  Given his CHADS score  of 2, long term anticoagulation should be strongly considered.  He is stable, full code, and will be admitted to [facility] service.  Other plans as per orders.  Code Status: FULL Unk Lightning, MD. [facility] Pager [phone] 7pm to 7am.  11/01/2011, 7:12 AM

## 2011-11-02 LAB — BASIC METABOLIC PANEL
BUN: 10 mg/dL (ref 6–23)
Calcium: 9.2 mg/dL (ref 8.4–10.5)
Creatinine, Ser: 0.93 mg/dL (ref 0.50–1.35)
GFR calc non Af Amer: >90 mL/min (ref >90)
Glucose, Bld: 191 mg/dL — ABNORMAL HIGH (ref 70–99)

## 2011-11-02 LAB — CBC
Hemoglobin: 15.3 g/dL (ref 13.0–17.0)
MCH: 31.6 pg (ref 26.0–34.0)
MCHC: 35.6 g/dL (ref 30.0–36.0)
RDW: 12.7 % (ref 11.5–15.5)

## 2011-11-02 LAB — HEPARIN LEVEL (UNFRACTIONATED): Heparin Unfractionated: <0.1 IU/mL — ABNORMAL LOW (ref 0.30–0.70)

## 2011-11-02 LAB — T4, FREE: Free T4: 1.59 ng/dL (ref 0.80–1.80)

## 2011-11-02 LAB — GLUCOSE, CAPILLARY

## 2011-11-02 MED ORDER — RIVAROXABAN 20 MG PO TABS
20.0000 mg | ORAL_TABLET | Freq: Every day | ORAL | Status: DC
  Filled 2011-11-02: qty 1

## 2011-11-02 MED ORDER — HEPARIN BOLUS VIA INFUSION
3000.0000 [IU] | Freq: Once | INTRAVENOUS | Status: DC
  Filled 2011-11-02: qty 3000

## 2011-11-02 MED ORDER — RIVAROXABAN 20 MG PO TABS: 20.0000 mg | ORAL_TABLET | Freq: Every day | ORAL | Status: AC

## 2011-11-02 MED ORDER — METOPROLOL TARTRATE 25 MG PO TABS: 25.0000 mg | ORAL_TABLET | Freq: Two times a day (BID) | ORAL | Status: AC

## 2011-11-02 MED ORDER — RIVAROXABAN 20 MG PO TABS
20.0000 mg | ORAL_TABLET | Freq: Every day | ORAL | Status: DC
  Administered 2011-11-02: 20 mg via ORAL
  Filled 2011-11-02 (×2): qty 1

## 2011-11-02 NOTE — Discharge Summary (Signed)
Physician Discharge Summary  Ricky Torres QMV:[id number] DOB: [birth year] DOA: 11/01/2011  PCP: No primary provider on file.  Admit date: 11/01/2011 Discharge date: 11/02/2011    Discharge Diagnoses:  Active Problems:  DM (diabetes mellitus)  HTN (hypertension)  Chest pain  Atrial fibrillation by electrocardiogram  Hypothyroidism   Discharge Condition: improved  Diet recommendation: cardiac  Wt Readings from Last 3 Encounters:  11/01/11 132.677 kg (292 lb 8 oz)    History of present illness:  Ricky Torres is an 46 y.o. male with history of hypertension, diabetes, hypothyroidism on supplement, hypo-testosterone on supplement, history of prior alcohol use but sober for 19 years, presents to the emergency room feeling tightness in his chest and lightheadedness. He was found to be in atrial fibrillation with controlled ventricular rate. Further evaluation included negative cardiac markers point-of-care, and chest x-ray was clear. In the emergency room he is pain-free. He is not having any tachycardia nor having any shortness of breath. He denied any leg swelling or calf tenderness, no recent surgery, and no distant travel. Hospitalist was asked to admit him for further evaluation and treatment of his new onset atrial fibrillation with controlled ventricular rate. He has no history of congestive heart failure, prior stroke or TIA (CHADS 2). He has no history of GI bleed or any contraindication to anticoagulation.    Hospital Course:  1. Paroxysmal atrial fibrillation--converted back to sinus rhythm, CHADSS 2 for DM and BP2 : Long term anticoagulation with xarelto.  Continue low dose BB to help prevent future episodes of AF- given prescription card 2. Chest pain with normal enzymes secondary to rapid rate.  3. DM- needs tight control    Consultations:  cardiology  Discharge Exam: Filed Vitals:   11/02/11 0300  BP: 134/90  Pulse: 61  Temp: 97.2 F (36.2 C)  Resp: 18   Filed Vitals:   11/01/11 0535 11/01/11 1427 11/01/11 2145 11/02/11 0300  BP: 170/92 125/74 144/99 134/90  Pulse: 89 72  61  Temp: 98.1 F (36.7 C) 97.9 F (36.6 C)  97.2 F (36.2 C)  TempSrc: Oral Oral  Oral  Resp: 18 18  18   Height:      Weight: 132.677 kg (292 lb 8 oz)     SpO2: 92% 95%  96%    General: A+Ox3, NAD Cardiovascular: rrr Respiratory: clear anterior Skin: no rashes or lesions  Discharge Instructions   Medication List  As of 11/02/2011 12:51 PM   ASK your doctor about these medications         amLODipine 10 MG tablet   Commonly known as: NORVASC   Take 10 mg by mouth daily.      imipramine 50 MG tablet   Commonly known as: TOFRANIL   Take 50 mg by mouth at bedtime.      insulin aspart 100 UNIT/ML injection   Commonly known as: novoLOG   Inject 45 Units into the skin 3 (three) times daily before meals.      levothyroxine 150 MCG tablet   Commonly known as: SYNTHROID, LEVOTHROID   Take 150 mcg by mouth daily.      losartan 100 MG tablet   Commonly known as: COZAAR   Take 100 mg by mouth daily.      lovastatin 20 MG tablet   Commonly known as: MEVACOR   Take 20 mg by mouth.      metFORMIN 1000 MG tablet   Commonly known as: GLUCOPHAGE   Take 1,000 mg by mouth 2 (two)  times daily with a meal.      testosterone enanthate 200 MG/ML injection   Commonly known as: DELATESTRYL   Inject 200 mg into the muscle every 14 (fourteen) days. For IM use only      VICTOZA [place]   Inject into the skin every morning.              The results of significant diagnostics from this hospitalization (including imaging, microbiology, ancillary and laboratory) are listed below for reference.    Significant Diagnostic Studies: Dg Chest 2 View  11/01/2011  *RADIOLOGY REPORT*  Clinical Data: Shortness of breath.  Chest pain.  CHEST - 2 VIEW  Comparison: None.  Findings: Shallow inspiration. The heart size and pulmonary vascularity are normal. The lungs appear clear and expanded without  focal air space disease or consolidation. No blunting of the costophrenic angles.  No pneumothorax.  Mediastinal contours are intact.  IMPRESSION: No evidence of active pulmonary disease.  Original Report Authenticated By: Marlon Pel, M.D.    Transthoracic Echocardiography  (Report amended )  Patient: Quillan, Whitter MR #: [id number] Study Date: 11/01/2011 Gender: M Age: 20 Height: 190.5cm Weight: 132.7kg BSA: 2.27m^2 Pt. Status: Room: 2011  PERFORMING [place], [facility] [place] SONOGRAPHER [place], [place] cc:  ------------------------------------------------------------ LV EF: 55% - 60%  ------------------------------------------------------------ Indications: Atrial fibrillation - 427.31.  ------------------------------------------------------------ History: PMH: chest pain, hypothyroidism Risk factors: Hypertension. Diabetes mellitus.  ------------------------------------------------------------ Study Conclusions  - Left ventricle: The cavity size was normal. Wall thickness was increased in a pattern of mild LVH. Systolic function was normal. The estimated ejection fraction was in the range of 55% to 60%. - Left atrium: The atrium was mildly dilated. - Atrial septum: No defect or patent foramen ovale was identified. Transthoracic echocardiography. M-mode, complete 2D, spectral Doppler, and color Doppler. Height: Height: 190.5cm. Height: 75in. Weight: Weight: 132.7kg. Weight: 292lb. Body mass index: BMI: 36.6kg/m^2. Body surface area: BSA: 2.48m^2. Blood pressure: 125/74. Patient status: Inpatient. Location: Bedside.     Microbiology: No results found for this or any previous visit (from the past 240 hour(s)).   Labs: Basic Metabolic Panel:  Lab 11/02/11 5621 11/01/11 0208  NA 138 140  K 4.6 3.8  CL 102 99  CO2 28 28  GLUCOSE 191* 186*  BUN 10 13  CREATININE 0.93 1.06  CALCIUM 9.2 10.4  MG -- --  PHOS -- --   Liver Function  Tests: No results found for this basename: AST:5,ALT:5,ALKPHOS:5,BILITOT:5,PROT:5,ALBUMIN:5 in the last 168 hours No results found for this basename: LIPASE:5,AMYLASE:5 in the last 168 hours No results found for this basename: AMMONIA:5 in the last 168 hours CBC:  Lab 11/02/11 0640 11/01/11 0208  WBC 9.0 11.2*  NEUTROABS -- --  HGB 15.3 17.2*  HCT 43.0 46.9  MCV 88.8 88.0  PLT 247 292   Cardiac Enzymes:  Lab 11/01/11 2215 11/01/11 1338 11/01/11 0630  CKTOTAL 140 183 250*  CKMB 5.2* 6.1* 6.8*  CKMBINDEX -- -- --  TROPONINI <0.30 <0.30 <0.30   BNP: BNP (last 3 results) No results found for this basename: PROBNP:3 in the last 8760 hours CBG:  Lab 11/02/11 1134 11/02/11 0550 11/01/11 2103 11/01/11 1658 11/01/11 1140  GLUCAP 202* 183* 74 179* 143*    Time coordinating discharge: 40 minutes  Signed:  Benjamine Mola, Taren Toops  [facility] 11/02/2011, 12:51 PM

## 2011-11-02 NOTE — Progress Notes (Signed)
Subjective:  Feels well. Remaining in NSR. No chest pain or dyspnea.  Objective:  Vital Signs in the last 24 hours: Temp:  [97.2 F (36.2 C)-97.9 F (36.6 C)] 97.2 F (36.2 C) (08/08 0300) Pulse Rate:  [61-72] 61  (08/08 0300) Resp:  [18] 18  (08/08 0300) BP: (125-144)/(74-99) 134/90 mmHg (08/08 0300) SpO2:  [95 %-96 %] 96 % (08/08 0300)  Intake/Output from previous day: 08/07 0701 - 08/08 0700 In: 3203 [P.O.:360; I.V.:2843] Out: 600 [Urine:600] Intake/Output from this shift:       . amLODipine  10 mg Oral Daily  . aspirin EC  81 mg Oral Daily  . heparin  3,000 Units Intravenous Once  . heparin  3,000 Units Intravenous Once  . heparin  3,000 Units Intravenous Once  . imipramine  50 mg Oral QHS  . insulin aspart  0-20 Units Subcutaneous TID WC  . insulin aspart  0-5 Units Subcutaneous QHS  . insulin aspart  45 Units Subcutaneous TID AC  . levothyroxine  150 mcg Oral QAC breakfast  . losartan  100 mg Oral Daily  . metFORMIN  1,000 mg Oral BID WC  . metoprolol tartrate  25 mg Oral BID  . simvastatin  10 mg Oral q1800  . sodium chloride  3 mL Intravenous Q12H      . sodium chloride 100 mL/hr at 11/01/11 1545  . heparin 2,500 Units/hr (11/02/11 0205)  . DISCONTD: heparin 1,700 Units/hr (11/01/11 0759)    Physical Exam: The patient appears to be in no distress.  Head and neck exam reveals that the pupils are equal and reactive.  The extraocular movements are full.  There is no scleral icterus.  Mouth and pharynx are benign.  No lymphadenopathy.  No carotid bruits.  The jugular venous pressure is normal.  Thyroid is not enlarged or tender.  Chest is clear to percussion and auscultation.  No rales or rhonchi.  Expansion of the chest is symmetrical.  Heart reveals no abnormal lift or heave.  First and second heart sounds are normal.  There is no murmur gallop rub or click.  The abdomen is soft and nontender.  Bowel sounds are normoactive.  There is no  hepatosplenomegaly or mass.  There are no abdominal bruits.  Extremities reveal no phlebitis or edema.  Pedal pulses are good.  There is no cyanosis or clubbing.  Neurologic exam is normal strength and no lateralizing weakness.  No sensory deficits.  Integument reveals no rash  Lab Results:  Basename 11/02/11 0640 11/01/11 0208  WBC 9.0 11.2*  HGB 15.3 17.2*  PLT 247 292    Basename 11/02/11 0640 11/01/11 0208  NA 138 140  K 4.6 3.8  CL 102 99  CO2 28 28  GLUCOSE 191* 186*  BUN 10 13  CREATININE 0.93 1.06    Basename 11/01/11 2215 11/01/11 1338  TROPONINI <0.30 <0.30   Hepatic Function Panel No results found for this basename: PROT,ALBUMIN,AST,ALT,ALKPHOS,BILITOT,BILIDIR,IBILI in the last 72 hours No results found for this basename: CHOL in the last 72 hours No results found for this basename: PROTIME in the last 72 hours  Imaging: Dg Chest 2 View  11/01/2011  *RADIOLOGY REPORT*  Clinical Data: Shortness of breath.  Chest pain.  CHEST - 2 VIEW  Comparison: None.  Findings: Shallow inspiration. The heart size and pulmonary vascularity are normal. The lungs appear clear and expanded without focal air space disease or consolidation. No blunting of the costophrenic angles.  No pneumothorax.  Mediastinal  contours are intact.  IMPRESSION: No evidence of active pulmonary disease.  Original Report Authenticated By: Marlon Pel, M.D.    Cardiac Studies:  Assessment/Plan:  Paroxysmal atrial fib now back in NSR on beta blocker. CHADSS2  Rec: Switch to long term anticoagulation.  Xarelto 20 mg daily may be preferable if affordable for him.  Continue BB. Will sign off now.   LOS: 1 day    Ricky Torres 11/02/2011, 8:01 AM

## 2011-11-02 NOTE — Progress Notes (Signed)
Heparin bolus given of 3000 units and rate increased to 25 ml/hr.

## 2011-11-02 NOTE — Care Management Note (Unsigned)
    Page 1 of 1   11/02/2011     1:23:20 PM   CARE MANAGEMENT NOTE 11/02/2011  Patient:  CORBY, VANDENBERGHE   Account Number:  [id number]  Date Initiated:  11/02/2011  Documentation initiated by:  SIMMONS,Tyus Kallam  Subjective/Objective Assessment:   ADMITTED WITH CP; LIVES AT HOME WITH SPOUSE; WAS IPTA.     Action/Plan:   DISCHARGE PLANNING.   Anticipated DC Date:  11/02/2011   Anticipated DC Plan:  HOME/SELF CARE      DC Planning Services  CM consult  Medication Assistance      Choice offered to / List presented to:             Status of service:  In process, will continue to follow Medicare Important Message given?   (If response is "NO", the following Medicare IM given date fields will be blank) Date Medicare IM given:   Date Additional Medicare IM given:    Discharge Disposition:    Per UR Regulation:  Reviewed for med. necessity/level of care/duration of stay  If discussed at Long Length of Stay Meetings, dates discussed:    Comments:  11/02/11  1322  Jasun Gasparini SIMMONS RN, BSN [phone] [employer] ASSISTANCE CARD PROVIDED TO PT WITH INSTRUCTIONS; READY FOR D/C.

## 2011-11-02 NOTE — Progress Notes (Signed)
ANTICOAGULATION CONSULT NOTE - Follow Up Consult  Pharmacy Consult for heparin Indication: atrial fibrillation  Labs:  Basename 11/02/11 0640 11/01/11 2309 11/01/11 2215 11/01/11 2214 11/01/11 1338 11/01/11 1334 11/01/11 0630 11/01/11 0208  HGB 15.3 -- -- -- -- -- -- 17.2*  HCT 43.0 -- -- -- -- -- -- 46.9  PLT 247 -- -- -- -- -- -- 292  APTT -- -- -- -- -- -- -- --  LABPROT -- -- -- -- -- 12.8 -- --  INR -- -- -- -- -- 0.94 -- --  HEPARINUNFRC <0.10* <0.10* -- <0.10* -- -- -- --  CREATININE -- -- -- -- -- -- -- 1.06  CKTOTAL -- -- 140 -- 183 -- 250* --  CKMB -- -- 5.2* -- 6.1* -- 6.8* --  TROPONINI -- -- <0.30 -- <0.30 -- <0.30 --    Assessment: 46yo male on IV heparin for atrial fibrillation. Heparin level (<0.1) is below-goal on 2500 units/hr.   Goal of Therapy:  Heparin level 0.3-0.7 units/ml   Plan:  1. Heparin IV bolus of 3000 units x 1, then increase IV infusion to 2950 units/hr. 2. Heparin level in 6 hours.   Emeline Gins PharmD BCPS 11/02/2011,7:22 AM

## 2012-06-27 ENCOUNTER — Encounter (HOSPITAL_BASED_OUTPATIENT_CLINIC_OR_DEPARTMENT_OTHER): Payer: BC Managed Care – PPO | Attending: Internal Medicine

## 2012-06-27 DIAGNOSIS — L02619 Cutaneous abscess of unspecified foot: Secondary | ICD-10-CM | POA: Insufficient documentation

## 2012-06-27 DIAGNOSIS — E039 Hypothyroidism, unspecified: Secondary | ICD-10-CM | POA: Insufficient documentation

## 2012-06-27 DIAGNOSIS — E1149 Type 2 diabetes mellitus with other diabetic neurological complication: Secondary | ICD-10-CM | POA: Insufficient documentation

## 2012-06-27 DIAGNOSIS — E1169 Type 2 diabetes mellitus with other specified complication: Secondary | ICD-10-CM | POA: Insufficient documentation

## 2012-06-27 DIAGNOSIS — E1142 Type 2 diabetes mellitus with diabetic polyneuropathy: Secondary | ICD-10-CM | POA: Insufficient documentation

## 2012-06-27 DIAGNOSIS — L97509 Non-pressure chronic ulcer of other part of unspecified foot with unspecified severity: Secondary | ICD-10-CM | POA: Insufficient documentation

## 2012-06-27 DIAGNOSIS — I10 Essential (primary) hypertension: Secondary | ICD-10-CM | POA: Insufficient documentation

## 2012-06-27 DIAGNOSIS — Z79899 Other long term (current) drug therapy: Secondary | ICD-10-CM | POA: Insufficient documentation

## 2012-06-27 DIAGNOSIS — L03039 Cellulitis of unspecified toe: Secondary | ICD-10-CM | POA: Insufficient documentation

## 2012-06-27 DIAGNOSIS — Z794 Long term (current) use of insulin: Secondary | ICD-10-CM | POA: Insufficient documentation

## 2012-06-28 NOTE — Progress Notes (Signed)
[facility]  NAME:  Ricky Torres, Ricky Torres                  ACCOUNT NO.:  [id number]  MEDICAL RECORD NO.:  [id number]      DATE OF BIRTH:  [birth year]  PHYSICIAN:  Maxwell Caul, M.D.      VISIT DATE:                                  OFFICE VISIT   HISTORY:  Ricky Torres is a 47 year old diabetic who developed a wound on the plantar aspect of his left 2nd toe.  He is kindly referred by Milus Height, PA of [facility] physicians.  The patient noted this wound roughly 12 days ago.  There was swelling, erythema of his left 2nd toe.  He is not totally certain how this happened as he is reasonably insensate from diabetic neuropathy.  He was put on Septra and an x-ray was done of the foot, although I do not have these results.  Lab work including a CBC and hemoglobin A1c was also done.  On the Septra, the patient has done better.  He has noted improvement in the degree of erythema of the 2nd toe, which at one point, I think approached the PIP.  This has certainly receded since then.  He has not been systemically unwell.  He does have a history of wounds on his bilateral toes, most recently on the left 2nd toe roughly 8 months ago.  PAST MEDICAL HISTORY:  Includes type 2 diabetes with diabetic neuropathy, hypothyroidism, gout, hypertension, hypogonadism with a history of erectile dysfunction, right knee surgery for meniscus repair.  MEDICATIONS:  Include Xarelto 20 mg daily (exact indication not clear at the moment), metoprolol 25 daily, Victoza 15 mg subcu daily, NovoLog 45 units 2 times daily, metformin 1000 b.i.d., Synthroid 150 mcg daily, lovastatin 20 mg daily, Norvasc 10 daily, losartan 100 mg daily, imipramine 25, 2 tablets at bedtime, Septra DS 800/160 b.i.d. p.o., which he has 3 more doses.  PHYSICAL EXAMINATION:  VITAL SIGNS:  Temperature 98, pulse 69, respirations 18, blood pressure 133/77. RESPIRATORY:  Clear air entry bilaterally.  CARDIAC:  Heart sounds are normal.   No signs of heart failure. EXTREMITIES:  Peripheral pulses are palpable.  ABIs in this clinic on the left calculated at 1.26.  WOUND EXAM:  The area in question was on the plantar left 2nd toe.  This was covered with a nonviable surface which was debrided.  Underneath the base of the granulation appears healthy.  A culture was done,  and I have extended the Septra for further 7 days.  The degree of erythema is mostly on the dorsal aspect of the toe and now up to the DIP joint (improved from his previous description).  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  There has been a significant improvement both by the patient account and also by reviewing the record from [place] which suggested a much more ominous situation.  I have therefore renewed the Septra for further week.  A culture of the toe was done post debridement.  I also note an x-ray has been done, although I do not have these results at the moment.  The toe was wrapped with silver alginate, Kerlix, and a toe sock.  The patient works at [employer] and is on his feet a lot.  I doubt they would let him work with any offloading sandal that I could provide. Therefore, we will see how we does in this difficult situation.  Review will be in 1 week.          ______________________________ Maxwell Caul, M.D.     MGR/MEDQ  D:  06/27/2012  T:  06/28/2012  Job:  [id number]

## 2014-02-26 ENCOUNTER — Ambulatory Visit (HOSPITAL_COMMUNITY): Payer: BC Managed Care – PPO | Attending: Internal Medicine | Admitting: Radiology

## 2014-02-26 VITALS — BP 149/80 | HR 69 | Ht 75.0 in | Wt 298.0 lb

## 2014-02-26 DIAGNOSIS — I10 Essential (primary) hypertension: Secondary | ICD-10-CM | POA: Insufficient documentation

## 2014-02-26 DIAGNOSIS — E109 Type 1 diabetes mellitus without complications: Secondary | ICD-10-CM | POA: Insufficient documentation

## 2014-02-26 DIAGNOSIS — E119 Type 2 diabetes mellitus without complications: Secondary | ICD-10-CM

## 2014-02-26 DIAGNOSIS — Z794 Long term (current) use of insulin: Secondary | ICD-10-CM | POA: Insufficient documentation

## 2014-02-26 DIAGNOSIS — R079 Chest pain, unspecified: Secondary | ICD-10-CM | POA: Diagnosis not present

## 2014-02-26 MED ORDER — TECHNETIUM TC 99M SESTAMIBI GENERIC - CARDIOLITE
33.0000 | Freq: Once | INTRAVENOUS | Status: AC | PRN
  Administered 2014-02-26: 33 via INTRAVENOUS

## 2014-02-26 MED ORDER — REGADENOSON 0.4 MG/5ML IV SOLN
0.4000 mg | Freq: Once | INTRAVENOUS | Status: AC
  Administered 2014-02-26: 0.4 mg via INTRAVENOUS

## 2014-02-26 MED ORDER — TECHNETIUM TC 99M SESTAMIBI GENERIC - CARDIOLITE
11.0000 | Freq: Once | INTRAVENOUS | Status: AC | PRN
  Administered 2014-02-26: 11 via INTRAVENOUS

## 2014-02-26 NOTE — Progress Notes (Signed)
[facility] SITE 3 NUCLEAR MED [address] [place], [place] [address] [phone]    Cardiology Nuclear Med Study  Ricky Torres is a 48 y.o. male     MRN : [id number]     DOB: 08/20/1965  Procedure Date: 02/26/2014  Nuclear Med Background Indication for Stress Test:  Evaluation for Ischemia History:  No known CAD Cardiac Risk Factors: Hypertension and IDDM Type 2  Symptoms:  Chest Pain (last date of chest discomfort was four days ago)   Nuclear Pre-Procedure Caffeine/Decaff Intake:  None NPO After: 8:30pm   Lungs:  clear O2 Sat: 94% on room air. IV 0.9% NS with Angio Cath:  22g  IV Site: R Hand  IV Started by:  Bonnita LevanJackie Smith, RN  Chest Size (in):  50 Cup Size: n/a  Height: 6\' 3"  (1.905 m)  Weight:  298 lb (135.172 kg)  BMI:  Body mass index is 37.25 kg/(m^2). Tech Comments:  CBG@7am  = 226    Nuclear Med Study 1 or 2 day study: 1 day  Stress Test Type:  Stress  Reading MD: N/A  Order Authorizing Provider:  Lupe Carneyean Mitchell, MD  Resting Radionuclide: Technetium 7989m Sestamibi  Resting Radionuclide Dose: 11.0 mCi   Stress Radionuclide:  Technetium 6489m Sestamibi  Stress Radionuclide Dose: 33.0 mCi           Stress Protocol Rest HR: 69 Stress HR: 113  Rest BP: 149/80 Stress BP: 141/63  Exercise Time (min): n/a METS: n/a   Predicted Max HR: 170 bpm % Max HR: 66.47 bpm Rate Pressure Product: [id number]   Dose of Adenosine (mg):  n/a Dose of Lexiscan: 0.4 mg  Dose of Atropine (mg): n/a Dose of Dobutamine: n/a mcg/kg/min (at max HR)  Stress Test Technologist: Nelson ChimesSharon Brooks, BS-ES  Nuclear Technologist:  Kerby NoraElzbieta Kubak, CNMT     Rest Procedure:  Myocardial perfusion imaging was performed at rest 45 minutes following the intravenous administration of Technetium 6189m Sestamibi. Rest ECG: NSR - Normal EKG  Stress Procedure:  The patient received IV Lexiscan 0.4 mg over 15-seconds with concurrent low level exercise and then Technetium 1489m Sestamibi was injected at 30-seconds  while the patient continued walking one more minute.  Quantitative spect images were obtained after a 45-minute delay.  Attempted to walk patient on Bruce Protocol but he fatigued before he could reach THR.  Switched to low level Lexiscan.  During the infusion the patient complained of SOB, fatigue and chest tightness.  These symptoms began to resolve in recovery.  Stress ECG: No significant ST segment change suggestive of ischemia.  QPS Raw Data Images:  Normal; no motion artifact; normal heart/lung ratio. Stress Images:  Normal homogeneous uptake in all areas of the myocardium. Rest Images:  Normal homogeneous uptake in all areas of the myocardium. Subtraction (SDS):  No evidence of ischemia. Transient Ischemic Dilatation (Normal <1.22):  1.03 Lung/Heart Ratio (Normal <0.45):  0.37  Quantitative Gated Spect Images QGS EDV:  111 ml QGS ESV:  48 ml  Impression Exercise Capacity:  Lexiscan with low level exercise. BP Response:  Normal blood pressure response. Clinical Symptoms:  Fatigue, patient had to stop and stage II. Felt some shortness of breath, chest tightness. ECG Impression:  No significant ST segment change suggestive of ischemia. Comparison with Prior Nuclear Study: No images to compare  Overall Impression:  Low risk stress nuclear study with no ischemia identified..  LV Ejection Fraction: 57%.  LV Wall Motion:  Mild septal wall hypokinesis. Otherwise, normal function  he did exercise 9 minutes and 45 seconds achieving 8.9 METs of activity on the Bruce protocol. Reassuring.   Donato SchultzSKAINS, Ricky Weimann, MD

## 2014-07-09 ENCOUNTER — Encounter: Payer: Self-pay | Admitting: Emergency Medicine

## 2014-07-09 ENCOUNTER — Emergency Department
Admission: EM | Admit: 2014-07-09 | Discharge: 2014-07-09 | Disposition: A | Discharge status: Home / Self Care | Payer: BLUE CROSS/BLUE SHIELD | Source: Home / Self Care | Attending: Emergency Medicine | Admitting: Emergency Medicine

## 2014-07-09 DIAGNOSIS — J209 Acute bronchitis, unspecified: Secondary | ICD-10-CM

## 2014-07-09 MED ORDER — AZITHROMYCIN 250 MG PO TABS: 250.0000 mg | ORAL_TABLET | Freq: Every day | ORAL | Status: DC

## 2014-07-09 MED ORDER — GUAIFENESIN-CODEINE 100-10 MG/5ML PO SOLN: 5.0000 mL | ORAL | Status: DC | PRN

## 2014-07-09 MED ORDER — BENZONATATE 100 MG PO CAPS: 100.0000 mg | ORAL_CAPSULE | Freq: Three times a day (TID) | ORAL | Status: DC

## 2014-07-09 NOTE — ED Notes (Signed)
Patient C/O cough,cold and congestion, times 1 week, non productive cough denies fever. Generalized aches.

## 2014-07-09 NOTE — Discharge Instructions (Signed)

## 2014-07-09 NOTE — ED Provider Notes (Signed)
CSN: [id number]     Arrival date & time 07/09/14  1042 History   First MD Initiated Contact with Patient 07/09/14 1140     Chief Complaint  Patient presents with  . URI   (Consider location/radiation/quality/duration/timing/severity/associated sxs/prior Treatment) Patient is a 49 y.o. male presenting with URI. The history is provided by the patient. No language interpreter was used.  URI Presenting symptoms: congestion   Severity:  Moderate Onset quality:  Gradual Timing:  Constant Progression:  Worsening Chronicity:  New Relieved by:  Nothing Worsened by:  Nothing tried Ineffective treatments:  None tried Risk factors: recent illness     Past Medical History  Diagnosis Date  . Diabetes mellitus   . Hypertension   . Thyroid disease   . Low testosterone   . Gout    Past Surgical History  Procedure Laterality Date  . Knee arthroscopy  2004   History reviewed. No pertinent family history. History  Substance Use Topics  . Smoking status: Never Smoker   . Smokeless tobacco: Current User    Types: Snuff  . Alcohol Use: Yes     Comment: occasionally    Review of Systems  HENT: Positive for congestion.   All other systems reviewed and are negative.   Allergies  Review of patient's allergies indicates no known allergies.  Home Medications   Prior to Admission medications   Medication Sig Start Date End Date Taking? Authorizing Provider  amLODipine (NORVASC) 10 MG tablet Take 10 mg by mouth daily.    Historical Provider, MD  azithromycin (ZITHROMAX) 250 MG tablet Take 1 tablet (250 mg total) by mouth daily. Take first 2 tablets together, then 1 every day until finished. 07/09/14   Elson Areas, PA-C  benzonatate (TESSALON) 100 MG capsule Take 1 capsule (100 mg total) by mouth every 8 (eight) hours. 07/09/14   Elson Areas, PA-C  guaiFENesin-codeine 100-10 MG/5ML syrup Take 5 mLs by mouth every 4 (four) hours as needed for cough. 07/09/14   Elson Areas, PA-C   imipramine (TOFRANIL) 50 MG tablet Take 50 mg by mouth at bedtime.     Historical Provider, MD  insulin aspart (NOVOLOG) 100 UNIT/ML injection Inject 45 Units into the skin 3 (three) times daily before meals.     Historical Provider, MD  levothyroxine (SYNTHROID, LEVOTHROID) 150 MCG tablet Take 150 mcg by mouth daily.    Historical Provider, MD  Liraglutide (VICTOZA Baring) Inject into the skin every morning.     Historical Provider, MD  losartan (COZAAR) 100 MG tablet Take 100 mg by mouth daily.    Historical Provider, MD  lovastatin (MEVACOR) 20 MG tablet Take 20 mg by mouth.    Historical Provider, MD  metFORMIN (GLUCOPHAGE) 1000 MG tablet Take 1,000 mg by mouth 2 (two) times daily with a meal.    Historical Provider, MD  metoprolol tartrate (LOPRESSOR) 25 MG tablet Take 1 tablet (25 mg total) by mouth 2 (two) times daily. 11/02/11 11/01/12  Joseph Art, DO  Rivaroxaban (XARELTO) 20 MG TABS Take 1 tablet (20 mg total) by mouth daily with breakfast. 11/02/11   Joseph Art, DO  testosterone enanthate (DELATESTRYL) 200 MG/ML injection Inject 200 mg into the muscle every 14 (fourteen) days. For IM use only    Historical Provider, MD   BP 154/82 mmHg  Pulse 77  Temp(Src) 97.7 F (36.5 C) (Oral)  Resp 16  Ht  (1.905 m)  Wt 286 lb 4 oz (129.842 kg)  BMI 35.78 kg/m2  SpO2 95% Physical Exam  Constitutional: He is oriented to person, place, and time. He appears well-developed and well-nourished.  HENT:  Head: Normocephalic.  Right Ear: External ear normal.  Left Ear: External ear normal.  Eyes: Conjunctivae and EOM are normal. Pupils are equal, round, and reactive to light.  Neck: Normal range of motion. Neck supple.  Cardiovascular: Normal rate.   Pulmonary/Chest: Effort normal.  Abdominal: Soft.  Musculoskeletal: Normal range of motion.  Neurological: He is alert and oriented to person, place, and time. He has normal reflexes.  Skin: Skin is warm.  Psychiatric: He has a normal mood  and affect.  Nursing note and vitals reviewed.   ED Course  Procedures (including critical care time) Labs Review Labs Reviewed - No data to display  Imaging Review No results found.   MDM   1. Acute bronchitis, unspecified organism    zithromax Tessalon robitussion ac Pt advised to follow up with his Md for recheck if symptoms persist    Elson AreasLeslie K Billiejean Schimek, [place] JerseyPA-C 07/09/14 1926

## 2014-09-14 ENCOUNTER — Encounter: Payer: Self-pay | Admitting: *Deleted

## 2014-09-14 ENCOUNTER — Emergency Department (INDEPENDENT_AMBULATORY_CARE_PROVIDER_SITE_OTHER): Payer: BLUE CROSS/BLUE SHIELD

## 2014-09-14 ENCOUNTER — Emergency Department
Admission: EM | Admit: 2014-09-14 | Discharge: 2014-09-14 | Disposition: A | Discharge status: Home / Self Care | Payer: BLUE CROSS/BLUE SHIELD | Source: Home / Self Care | Attending: Family Medicine | Admitting: Family Medicine

## 2014-09-14 DIAGNOSIS — L03031 Cellulitis of right toe: Secondary | ICD-10-CM | POA: Diagnosis not present

## 2014-09-14 DIAGNOSIS — M79674 Pain in right toe(s): Secondary | ICD-10-CM

## 2014-09-14 DIAGNOSIS — L97519 Non-pressure chronic ulcer of other part of right foot with unspecified severity: Secondary | ICD-10-CM

## 2014-09-14 DIAGNOSIS — E11621 Type 2 diabetes mellitus with foot ulcer: Secondary | ICD-10-CM | POA: Diagnosis not present

## 2014-09-14 LAB — POCT CBC W AUTO DIFF (K'VILLE URGENT CARE)

## 2014-09-14 MED ORDER — HYDROCODONE-ACETAMINOPHEN 5-325 MG PO TABS: 1.0000 | ORAL_TABLET | Freq: Four times a day (QID) | ORAL | Status: DC | PRN

## 2014-09-14 MED ORDER — DOXYCYCLINE HYCLATE 100 MG PO CAPS: 100.0000 mg | ORAL_CAPSULE | Freq: Two times a day (BID) | ORAL | Status: AC

## 2014-09-14 NOTE — ED Notes (Signed)
Pt c/o RT 2nd toe infection x 11 days, after pulling a piece of dead skin from it. History of diabetes.

## 2014-09-14 NOTE — Discharge Instructions (Signed)
Elevate leg as much as possible.  Apply heating pad several times daily. If symptoms become significantly worse during the night or over the weekend, proceed to the local emergency room.    Cellulitis Cellulitis is an infection of the skin and the tissue beneath it. The infected area is usually red and tender. Cellulitis occurs most often in the arms and lower legs.  CAUSES  Cellulitis is caused by bacteria that enter the skin through cracks or cuts in the skin. The most common types of bacteria that cause cellulitis are staphylococci and streptococci. SIGNS AND SYMPTOMS   Redness and warmth.  Swelling.  Tenderness or pain.  Fever. DIAGNOSIS  Your health care provider can usually determine what is wrong based on a physical exam. Blood tests may also be done. TREATMENT  Treatment usually involves taking an antibiotic medicine. HOME CARE INSTRUCTIONS   Take your antibiotic medicine as directed by your health care provider. Finish the antibiotic even if you start to feel better.  Keep the infected arm or leg elevated to reduce swelling.  Apply a warm cloth to the affected area up to 4 times per day to relieve pain.  Take medicines only as directed by your health care provider.  Keep all follow-up visits as directed by your health care provider. SEEK MEDICAL CARE IF:   You notice red streaks coming from the infected area.  Your red area gets larger or turns dark in color.  Your bone or joint underneath the infected area becomes painful after the skin has healed.  Your infection returns in the same area or another area.  You notice a swollen bump in the infected area.  You develop new symptoms.  You have a fever. SEEK IMMEDIATE MEDICAL CARE IF:   You feel very sleepy.  You develop vomiting or diarrhea.  You have a general ill feeling (malaise) with muscle aches and pains. MAKE SURE YOU:   Understand these instructions.  Will watch your condition.  Will get help  right away if you are not doing well or get worse. Document Released: 12/21/2004 Document Revised: 07/28/2013 Document Reviewed: 05/29/2011 [facility] Patient Information 2015 [place], [place]. This information is not intended to replace advice given to you by your health care provider. Make sure you discuss any questions you have with your health care provider.

## 2014-09-14 NOTE — ED Provider Notes (Addendum)
CSN: [id number]     Arrival date & time 09/14/14  1303 History   First MD Initiated Contact with Patient 09/14/14 1413     Chief Complaint  Patient presents with  . Toe Pain      HPI Comments: Patient states that about 11 days ago he pulled a piece of "dead skin" (?callus) from the plantar surface of his right second toe.  The toe gradually became increasingly tender to palpation, worse during the past two days.  Last night his toe became more swollen and red, with mild swelling and red streaking on the dorsum of his right foot.  The plantar surface of the toe has had a persistent ulcer, with small amounts of drainage.  He denies fevers, chills, and sweats, and feels well otherwise.  He has diabetes and reports peripheral neuropathy with decreased sensation in his feet.  The history is provided by the patient.    Past Medical History  Diagnosis Date  . Diabetes mellitus   . Hypertension   . Thyroid disease   . Low testosterone   . Gout    Past Surgical History  Procedure Laterality Date  . Knee arthroscopy  2004   History reviewed. No pertinent family history. History  Substance Use Topics  . Smoking status: Never Smoker   . Smokeless tobacco: Current User    Types: Snuff  . Alcohol Use: Yes     Comment: occasionally    Review of Systems  Constitutional: Negative for fever, chills, diaphoresis, activity change and fatigue.  HENT: Negative.   Eyes: Negative.   Respiratory: Negative.   Cardiovascular: Negative.   Gastrointestinal: Negative.   Genitourinary: Negative.   Musculoskeletal: Positive for joint swelling. Negative for myalgias.       Toe pain  Skin: Positive for color change.  Neurological: Negative for headaches.    Allergies  Review of patient's allergies indicates no known allergies.  Home Medications   Prior to Admission medications   Medication Sig Start Date End Date Taking? Authorizing Provider  amLODipine (NORVASC) 10 MG tablet Take 10 mg by mouth  daily.   Yes Historical Provider, MD  imipramine (TOFRANIL) 50 MG tablet Take 50 mg by mouth at bedtime.    Yes Historical Provider, MD  insulin aspart (NOVOLOG) 100 UNIT/ML injection Inject 45 Units into the skin 3 (three) times daily before meals.    Yes Historical Provider, MD  insulin detemir (LEVEMIR) 100 UNIT/ML injection Inject into the skin at bedtime.   Yes Historical Provider, MD  levothyroxine (SYNTHROID, LEVOTHROID) 150 MCG tablet Take 150 mcg by mouth daily.   Yes Historical Provider, MD  Liraglutide (VICTOZA ) Inject into the skin every morning.    Yes Historical Provider, MD  losartan (COZAAR) 100 MG tablet Take 100 mg by mouth daily.   Yes Historical Provider, MD  lovastatin (MEVACOR) 20 MG tablet Take 20 mg by mouth.   Yes Historical Provider, MD  metFORMIN (GLUCOPHAGE) 1000 MG tablet Take 1,000 mg by mouth 2 (two) times daily with a meal.   Yes Historical Provider, MD  metoprolol tartrate (LOPRESSOR) 25 MG tablet Take 1 tablet (25 mg total) by mouth 2 (two) times daily. 11/02/11 09/14/14 Yes Joseph Art, DO  doxycycline (VIBRAMYCIN) 100 MG capsule Take 1 capsule (100 mg total) by mouth 2 (two) times daily. Take with food. 09/14/14   Lattie Haw, MD  HYDROcodone-acetaminophen (NORCO/VICODIN) 5-325 MG per tablet Take 1 tablet by mouth every 6 (six) hours as needed for  moderate pain. 09/14/14   Lattie Haw, MD  Rivaroxaban (XARELTO) 20 MG TABS Take 1 tablet (20 mg total) by mouth daily with breakfast. 11/02/11   Joseph Art, DO  testosterone enanthate (DELATESTRYL) 200 MG/ML injection Inject 200 mg into the muscle every 14 (fourteen) days. For IM use only    Historical Provider, MD   BP 124/76 mmHg  Pulse 70  Temp(Src) 98.3 F (36.8 C) (Oral)  Resp 16  Ht 6\' 3"  (1.905 m)  Wt 287 lb (130.182 kg)  BMI 35.87 kg/m2  SpO2 96% Physical Exam  Constitutional: He is oriented to person, place, and time. He appears well-developed and well-nourished. No distress.  Patient is  obese (BMI 35.9). Patient walks with limp.  HENT:  Head: Normocephalic.  Mouth/Throat: Oropharynx is clear and moist.  Eyes: Conjunctivae are normal. Pupils are equal, round, and reactive to light.  Neck: Neck supple.  Cardiovascular: Normal heart sounds.   Pulmonary/Chest: Breath sounds normal.  Abdominal: There is no tenderness.  Musculoskeletal: He exhibits no edema.       Right foot: There is tenderness and swelling. There is normal range of motion, no bony tenderness and normal capillary refill.       Feet:  Right second toe distal phalanx and IP joint is erythematous and mildly swollen.  Cap refill is intact.  There is a 1cm diameter shallow ulcer plantar surface distal phalanx with surrounding bulla.  Dorsum of foot has mild swelling, erythema, tenderness and ascending lymphangitis  Neurological: He is alert and oriented to person, place, and time.  Skin: Skin is warm and dry.  Nursing note and vitals reviewed.   ED Course  Procedures  With sterile technique and prep with Betadine/alcohol, incised small 22mm opening in bulla to obtain culture; minimal fluid expressed.  Applied Xeroform gauze bandage, Kerlix, and CoBan wrap.   Labs Reviewed  C-REACTIVE PROTEIN  SEDIMENTATION RATE  POCT CBC W AUTO DIFF ([facility]):  WBC 9.5; LY 20.6; MO 2.8; GR 76.6; Hgb 17.8; Platelets 289     Imaging Review Dg Toe 2nd Right  09/14/2014   CLINICAL DATA:  Diabetic foot ulcer.  Foot infection.  Cellulitis.  EXAM: RIGTH SECOND TOE  COMPARISON:  None.  FINDINGS: No plain film evidence of osteomyelitis. Ulceration is present over the terminal phalanx medial aspect of the RIGHT second toe. Second toe clinodactyly. There is deformity of the distal third toe proximal phalanx compatible with fracture although this is age indeterminate on these radiographs. This does not appear healed based on these films. This is unlikely to represent infection/osteomyelitis.  IMPRESSION: No plain film evidence  of osteomyelitis. Ulceration over the medial aspect of the distal RIGHT second toe. Age indeterminate fracture of the distal third toe proximal phalanx.   Electronically Signed   By: Andreas Newport M.D.   On: 09/14/2014 15:47     MDM   1. Cellulitis of second toe, right   2. Toe pain, right    Sed Rate and C-reactive protein pending Wound culture pending.  Begin doxycycline 100mg  bid for staph coverage.  Lortab q6hr prn pain. Elevate leg as much as possible.  Apply heating pad several times daily. If symptoms become significantly worse during the night or over the weekend, proceed to the local emergency room. Return for follow-up in 24 hours.  If not improving 2 to 3 days may need orthopedic referral.    Lattie Haw, MD 09/15/14 1453  Lattie Haw, MD 09/15/14 [phone]

## 2014-09-15 ENCOUNTER — Encounter: Payer: Self-pay | Admitting: *Deleted

## 2014-09-15 ENCOUNTER — Emergency Department (INDEPENDENT_AMBULATORY_CARE_PROVIDER_SITE_OTHER)
Admission: EM | Admit: 2014-09-15 | Discharge: 2014-09-15 | Disposition: A | Discharge status: Home / Self Care | Payer: BLUE CROSS/BLUE SHIELD | Source: Home / Self Care | Attending: Emergency Medicine | Admitting: Emergency Medicine

## 2014-09-15 DIAGNOSIS — L03031 Cellulitis of right toe: Secondary | ICD-10-CM

## 2014-09-15 DIAGNOSIS — S92501D Displaced unspecified fracture of right lesser toe(s), subsequent encounter for fracture with routine healing: Secondary | ICD-10-CM

## 2014-09-15 LAB — SEDIMENTATION RATE: SED RATE: 12 mm/h (ref 0–15)

## 2014-09-15 LAB — C-REACTIVE PROTEIN: CRP: 1.2 mg/dL — ABNORMAL HIGH (ref <0.60)

## 2014-09-15 NOTE — ED Provider Notes (Signed)
CSN: [id number]     Arrival date & time 09/15/14  [facility] History   First MD Initiated Contact with Patient 09/15/14 1637     Chief Complaint  Patient presents with  . Toe Pain    HPI Was seen here yesterday by Dr. Cathren Harsh for cellulitis and ascending lymphangitis of right second toe. Reviewed those notes at length, including history of pulling off a piece of dead skin and right second toe 12 days ago, but does not recall any specific other trauma. Yesterday, Dr. Cathren Harsh did I&D of the tiny bullae right toe. Was placed on doxycycline and wound culture sent. Here for recheck. He reports that the pain and swelling has significantly decreased. No drainage. He has chronic numbness and peripheral neuropathy of his feet. Denies fever or chills or sweats or nausea or vomiting or chest pain or shortness of breath. Past Medical History  Diagnosis Date  . Diabetes mellitus   . Hypertension   . Thyroid disease   . Low testosterone   . Gout    Past Surgical History  Procedure Laterality Date  . Knee arthroscopy  2004   History reviewed. No pertinent family history. History  Substance Use Topics  . Smoking status: Never Smoker   . Smokeless tobacco: Current User    Types: Snuff  . Alcohol Use: Yes     Comment: occasionally    Review of Systems  All other systems reviewed and are negative.   Allergies  Review of patient's allergies indicates no known allergies.  Home Medications   Prior to Admission medications   Medication Sig Start Date End Date Taking? Authorizing Provider  amLODipine (NORVASC) 10 MG tablet Take 10 mg by mouth daily.    Historical Provider, MD  doxycycline (VIBRAMYCIN) 100 MG capsule Take 1 capsule (100 mg total) by mouth 2 (two) times daily. Take with food. 09/14/14   Lattie Haw, MD  HYDROcodone-acetaminophen (NORCO/VICODIN) 5-325 MG per tablet Take 1 tablet by mouth every 6 (six) hours as needed for moderate pain. 09/14/14   Lattie Haw, MD  imipramine (TOFRANIL) 50 MG tablet Take 50 mg by mouth at bedtime.     Historical Provider, MD  insulin aspart (NOVOLOG) 100 UNIT/ML injection Inject 45 Units into the skin 3 (three) times daily before meals.     Historical Provider, MD  insulin detemir (LEVEMIR) 100 UNIT/ML injection Inject into the skin at bedtime.    Historical Provider, MD  levothyroxine (SYNTHROID, LEVOTHROID) 150 MCG tablet Take 150 mcg by mouth daily.    Historical Provider, MD  Liraglutide (VICTOZA [place]) Inject into the skin every morning.     Historical Provider, MD  losartan (COZAAR) 100 MG tablet Take 100 mg by mouth daily.    Historical Provider, MD  lovastatin (MEVACOR) 20 MG tablet Take 20 mg by mouth.    Historical Provider, MD  metFORMIN (GLUCOPHAGE) 1000 MG tablet Take 1,000 mg by mouth 2 (two) times daily with a meal.    Historical Provider, MD  metoprolol tartrate (LOPRESSOR) 25 MG tablet Take 1 tablet (25 mg total) by mouth 2 (two) times daily. 11/02/11 09/14/14  Joseph Art, DO  Rivaroxaban (XARELTO) 20 MG TABS Take 1 tablet (20 mg total) by mouth daily with breakfast. 11/02/11   Joseph Art, DO  testosterone enanthate (DELATESTRYL) 200 MG/ML injection Inject 200 mg into the muscle every 14 (fourteen) days. For IM use only    Historical Provider, MD   Temp(Src)  97.8 F (36.6 C) (Oral) Physical Exam  Constitutional: He is oriented to person, place, and time. He appears well-developed and well-nourished. No distress.  HENT:  Head: Normocephalic and atraumatic.  Eyes: Conjunctivae and EOM are normal. Pupils are equal, round, and reactive to light. No scleral icterus.  Neck: Normal range of motion.  Cardiovascular: Normal rate.   Pulmonary/Chest: Effort normal.  Abdominal: He exhibits no distension.  Musculoskeletal: Normal range of motion.  Neurological: He is alert and oriented to person, place, and time.  Skin: Skin is warm.  Psychiatric: He has a normal mood and affect.  Nursing note and  vitals reviewed.  right second toe is granulating in on the plantar aspect. Some redness and warmth and induration, he reports this is decreased from yesterday. No fluctuance or drainage. He has chronic decreased sensation of toes that he states is unchanged. Nontender  ED Course  Procedures (including critical care time) Labs Review From visit yesterday Labs Reviewed - No data to display Results for orders placed or performed during the hospital encounter of 09/14/14  Wound culture  Result Value Ref Range   Gram Stain No WBC Seen    Gram Stain No Squamous Epithelial Cells Seen    Gram Stain Abundant Gram Positive Cocci In Pairs In Chains   C-reactive protein  Result Value Ref Range   CRP 1.2 (H) <0.60 mg/dL  Sedimentation rate  Result Value Ref Range   Sed Rate 12 0 - 15 mm/hr  CBC w auto diff ([facility])  Result Value Ref Range   WBC  4.5 - 10.5 K/uL   Lymphocytes relative %  15 - 45 %   Monocytes relative %  2 - 10 %   Neutrophils relative % (GR)  44 - 76 %   Lymphocytes absolute  0.1 - 1.8 K/uL   Monocyes absolute  0.1 - 1 K/uL   Neutrophils absolute (GR#)  1.7 - 7.7 K/uL   RBC  4.2 - 5.8 MIL/uL   Hemoglobin  13 - 17 g/dL   Hematocrit  16.1 - 51 %   MCV  80 - 98 fL   MCH  26.5 - 32.5 pg   MCHC  32.5 - 36.9 g/dL   RDW  09.6 - 14 %   Platelet count  140 - 400 K/uL   MPV  7.8 - 11 fL    Imaging Review Dg Toe 2nd Right  09/14/2014   CLINICAL DATA:  Diabetic foot ulcer.  Foot infection.  Cellulitis.  EXAM: RIGTH SECOND TOE  COMPARISON:  None.  FINDINGS: No plain film evidence of osteomyelitis. Ulceration is present over the terminal phalanx medial aspect of the RIGHT second toe. Second toe clinodactyly. There is deformity of the distal third toe proximal phalanx compatible with fracture although this is age indeterminate on these radiographs. This does not appear healed based on these films. This is unlikely to represent infection/osteomyelitis.  IMPRESSION: No  plain film evidence of osteomyelitis. Ulceration over the medial aspect of the distal RIGHT second toe. Age indeterminate fracture of the distal third toe proximal phalanx.   Electronically Signed   By: Andreas Newport M.D.   On: 09/14/2014 15:47     MDM   1. Cellulitis of second toe, right   2. Closed fracture of third toe of right foot with routine healing   --age undetermined. He is clinically improving from yesterday on doxycycline. From yesterday's x-ray: No evidence of osteomyelitis. Age indeterminate fracture of the distal right  third toe proximal phalanx.  Treatment options discussed, as well as risks, benefits, alternatives. Patient voiced understanding and agreement with the following plans: Wound redressed with scant Polysporin and nonstick dressing and bandage. Wound care discussed. I explained that initial Gram stain of swab yesterday shows gram-positive cocci, likely staph but culture pending. Continue doxycycline as prescribed. Recheck 2-3 days.--Sooner if worse or new symptoms. Red flags discussed. If any red flag, go to emergency room stat. I also urged him and advised him to get an appointment to follow-up with podiatrist within 7 days and for ongoing podiatric diabetic care, and explain risks of his not doing so. I explained how serious diabetic peripheral neuropathy is, and risks to toes and feet, including the possible risks of worsening infection, amputation, or even death. He voiced understanding. Also explain the importance of following up with PCP within 7 days for management and treatment of his diabetes and I explained risks of not doing so.  Precautions discussed. Red flags discussed. Questions invited and answered. Patient voiced understanding and agreement.     Lajean Manes, MD 09/15/14 [phone]

## 2014-09-15 NOTE — ED Notes (Signed)
Ricky Torres is here today for a recheck of his RT 2nd toe infection.

## 2014-09-17 LAB — WOUND CULTURE
GRAM STAIN: NONE SEEN
GRAM STAIN: NONE SEEN

## 2014-09-18 ENCOUNTER — Telehealth: Payer: Self-pay | Admitting: Emergency Medicine

## 2014-09-18 ENCOUNTER — Encounter: Payer: Self-pay | Admitting: Emergency Medicine

## 2014-09-18 ENCOUNTER — Emergency Department (INDEPENDENT_AMBULATORY_CARE_PROVIDER_SITE_OTHER)
Admission: EM | Admit: 2014-09-18 | Discharge: 2014-09-18 | Disposition: A | Discharge status: Home / Self Care | Payer: BLUE CROSS/BLUE SHIELD | Source: Home / Self Care | Attending: Family Medicine | Admitting: Family Medicine

## 2014-09-18 DIAGNOSIS — L03031 Cellulitis of right toe: Secondary | ICD-10-CM | POA: Diagnosis not present

## 2014-09-18 MED ORDER — CEFTRIAXONE SODIUM 1 G IJ SOLR
1.0000 g | Freq: Once | INTRAMUSCULAR | Status: AC
  Administered 2014-09-18: 1 g via INTRAMUSCULAR

## 2014-09-18 MED ORDER — AMOXICILLIN 875 MG PO TABS: 875.0000 mg | ORAL_TABLET | Freq: Two times a day (BID) | ORAL | Status: DC

## 2014-09-18 MED ORDER — HYDROCODONE-ACETAMINOPHEN 5-325 MG PO TABS: 1.0000 | ORAL_TABLET | Freq: Four times a day (QID) | ORAL | Status: AC | PRN

## 2014-09-18 NOTE — ED Provider Notes (Signed)
CSN: [id number]     Arrival date & time 09/18/14  1758 History   First MD Initiated Contact with Patient 09/18/14 1842    Returns for followup of toe infection     HPI Comments: Patient reports that his toe had improved initially but became worse yesterday.  He has had vague increased soreness in the dorsum of his foot.  The history is provided by the patient.    Past Medical History  Diagnosis Date  . Diabetes mellitus   . Hypertension   . Thyroid disease   . Low testosterone   . Gout    Past Surgical History  Procedure Laterality Date  . Knee arthroscopy  2004   No family history on file. History  Substance Use Topics  . Smoking status: Never Smoker   . Smokeless tobacco: Current User    Types: Snuff  . Alcohol Use: Yes     Comment: occasionally    Review of Systems No fevers, chills, and sweats  Allergies  Review of patient's allergies indicates no known allergies.  Home Medications   Prior to Admission medications   Medication Sig Start Date End Date Taking? Authorizing Provider  amLODipine (NORVASC) 10 MG tablet Take 10 mg by mouth daily.    Historical Provider, MD  amoxicillin (AMOXIL) 875 MG tablet Take 1 tablet (875 mg total) by mouth 2 (two) times daily. 09/18/14   Lattie Haw, MD  doxycycline (VIBRAMYCIN) 100 MG capsule Take 1 capsule (100 mg total) by mouth 2 (two) times daily. Take with food. 09/14/14   Lattie Haw, MD  HYDROcodone-acetaminophen (NORCO/VICODIN) 5-325 MG per tablet Take 1 tablet by mouth every 6 (six) hours as needed for moderate pain. 09/14/14   Lattie Haw, MD  imipramine (TOFRANIL) 50 MG tablet Take 50 mg by mouth at bedtime.     Historical Provider, MD  insulin aspart (NOVOLOG) 100 UNIT/ML injection Inject 45 Units into the skin 3 (three) times daily before meals.     Historical Provider, MD  insulin detemir (LEVEMIR) 100 UNIT/ML injection Inject into the skin at bedtime.    Historical Provider, MD  levothyroxine (SYNTHROID,  LEVOTHROID) 150 MCG tablet Take 150 mcg by mouth daily.    Historical Provider, MD  Liraglutide (VICTOZA [place]) Inject into the skin every morning.     Historical Provider, MD  losartan (COZAAR) 100 MG tablet Take 100 mg by mouth daily.    Historical Provider, MD  lovastatin (MEVACOR) 20 MG tablet Take 20 mg by mouth.    Historical Provider, MD  metFORMIN (GLUCOPHAGE) 1000 MG tablet Take 1,000 mg by mouth 2 (two) times daily with a meal.    Historical Provider, MD  metoprolol tartrate (LOPRESSOR) 25 MG tablet Take 1 tablet (25 mg total) by mouth 2 (two) times daily. 11/02/11 09/14/14  Joseph Art, DO  Rivaroxaban (XARELTO) 20 MG TABS Take 1 tablet (20 mg total) by mouth daily with breakfast. 11/02/11   Joseph Art, DO  testosterone enanthate (DELATESTRYL) 200 MG/ML injection Inject 200 mg into the muscle every 14 (fourteen) days. For IM use only    Historical Provider, MD   There were no vitals taken for this visit. Physical Exam Nursing notes and Vital Signs reviewed. Appearance:  Patient appears in no acute distress Right second toe:  Shallow ulcer plantar surface shows good granulation tissue.  Proximal toe has increased erythema.  Dorsum of foot shows no erythema or ascending lymphangitis.  No tenderness to palpation.  ED Course  Procedures Lavaged toe with sterile saline.  Applied Xeroform gauze followed by Kerlix gauze and light wrap of Coban    Labs Reviewed  WOUND CULTURE         MDM   1. Cellulitis of second toe, right    Repeat wound culture. Previous culture showed group B strep.  Although cellulitis improved initially on doxycycline, group B strep should respond better to cephalosporins and amoxicillin/ampicillin, etc. Rocephin 1gm IM.  Rx for amoxicillin  BID Elevate leg as much as possible.  Change dressing daily.  Given packs of Xeroform gauze.  Begin amoxicillin tomorrow (09/19/14) If symptoms become significantly worse during the night or over the weekend,  proceed to the local emergency room.  Return for follow-up in 48 hours.    Lattie Haw, MD 09/22/14 [phone]

## 2014-09-18 NOTE — ED Notes (Signed)
Called earlier today to report that infection in toe seems worse despite rx.

## 2014-09-18 NOTE — Discharge Instructions (Signed)
Elevate leg as much as possible.  Change dressing daily.  Begin amoxicillin tomorrow (09/19/14) If symptoms become significantly worse during the night or over the weekend, proceed to the local emergency room.

## 2014-09-20 ENCOUNTER — Emergency Department (INDEPENDENT_AMBULATORY_CARE_PROVIDER_SITE_OTHER)
Admission: EM | Admit: 2014-09-20 | Discharge: 2014-09-20 | Disposition: A | Discharge status: Home / Self Care | Payer: BLUE CROSS/BLUE SHIELD | Source: Home / Self Care | Attending: Family Medicine | Admitting: Family Medicine

## 2014-09-20 ENCOUNTER — Encounter: Payer: Self-pay | Admitting: Emergency Medicine

## 2014-09-20 DIAGNOSIS — L03031 Cellulitis of right toe: Secondary | ICD-10-CM | POA: Diagnosis not present

## 2014-09-20 LAB — WOUND CULTURE
GRAM STAIN: NONE SEEN
Gram Stain: NONE SEEN

## 2014-09-20 NOTE — ED Notes (Signed)
Pt here for recheck of right toe. States it is doing better.

## 2014-09-20 NOTE — ED Provider Notes (Signed)
CSN: [id number]     Arrival date & time 09/20/14  1354 History   First MD Initiated Contact with Patient 09/20/14 1408     Chief Complaint  Patient presents with  . Wound Check      HPI Comments: Patient returns for follow-up and dressing change of his right second toe.  He reports resolution of pain in his foot.  Minimal drainage from toe.  The history is provided by the patient.    Past Medical History  Diagnosis Date  . Diabetes mellitus   . Hypertension   . Thyroid disease   . Low testosterone   . Gout    Past Surgical History  Procedure Laterality Date  . Knee arthroscopy  2004   History reviewed. No pertinent family history. History  Substance Use Topics  . Smoking status: Never Smoker   . Smokeless tobacco: Current User    Types: Snuff  . Alcohol Use: Yes     Comment: occasionally    Review of Systems No fevers, chills, and sweats.  Allergies  Review of patient's allergies indicates no known allergies.  Home Medications   Prior to Admission medications   Medication Sig Start Date End Date Taking? Authorizing Provider  amLODipine (NORVASC) 10 MG tablet Take 10 mg by mouth daily.    Historical Provider, MD  amoxicillin (AMOXIL) 875 MG tablet Take 1 tablet (875 mg total) by mouth 2 (two) times daily. 09/18/14   Lattie Haw, MD  doxycycline (VIBRAMYCIN) 100 MG capsule Take 1 capsule (100 mg total) by mouth 2 (two) times daily. Take with food. 09/14/14   Lattie Haw, MD  HYDROcodone-acetaminophen (NORCO/VICODIN) 5-325 MG per tablet Take 1 tablet by mouth every 6 (six) hours as needed for moderate pain. 09/18/14   Lattie Haw, MD  imipramine (TOFRANIL) 50 MG tablet Take 50 mg by mouth at bedtime.     Historical Provider, MD  insulin aspart (NOVOLOG) 100 UNIT/ML injection Inject 45 Units into the skin 3 (three) times daily before meals.     Historical Provider, MD  insulin detemir (LEVEMIR) 100 UNIT/ML injection Inject into the skin at bedtime.    Historical  Provider, MD  levothyroxine (SYNTHROID, LEVOTHROID) 150 MCG tablet Take 150 mcg by mouth daily.    Historical Provider, MD  Liraglutide (VICTOZA [place]) Inject into the skin every morning.     Historical Provider, MD  losartan (COZAAR) 100 MG tablet Take 100 mg by mouth daily.    Historical Provider, MD  lovastatin (MEVACOR) 20 MG tablet Take 20 mg by mouth.    Historical Provider, MD  metFORMIN (GLUCOPHAGE) 1000 MG tablet Take 1,000 mg by mouth 2 (two) times daily with a meal.    Historical Provider, MD  metoprolol tartrate (LOPRESSOR) 25 MG tablet Take 1 tablet (25 mg total) by mouth 2 (two) times daily. 11/02/11 09/14/14  Joseph Art, DO  Rivaroxaban (XARELTO) 20 MG TABS Take 1 tablet (20 mg total) by mouth daily with breakfast. 11/02/11   Joseph Art, DO  testosterone enanthate (DELATESTRYL) 200 MG/ML injection Inject 200 mg into the muscle every 14 (fourteen) days. For IM use only    Historical Provider, MD   BP 130/79 mmHg  Pulse 72  Temp(Src) 97.9 F (36.6 C) (Oral)  SpO2 97% Physical Exam Right second toe:  Persistent necrotic skin on medial and lateral aspects of toe.  Persistent shallow ulcer on plantar surface.  Minimal erythema proximal toe.  ED Course  Procedures Right second  toe lavaged with sterile saline.  With sterile technique, carefully debrided necrotic skin, revealing viable skin beneath.  No purulent drainage.  Shallow ulcer on plantar surface shows good granulation tissue.  Applied Xeroform gauze dressing, followed by Kerlix gauze and light wrap of CoBan.   Labs Review:  Repeat culture from previous visit shows persistent Group B strep.     MDM   1. Cellulitis of second toe, right; improved     Leave present dressing in place until 09/22/14, then replace (given pack of Xeroform gauze).  Elevate leg as much as possible.  Continue antibiotic. Return in 3 days for dressing change.    Lattie Haw, MD 09/22/14 [phone]

## 2014-09-20 NOTE — Discharge Instructions (Signed)
Leave present dressing in place until 09/22/14, then replace.  Elevate leg as much as possible.  Continue antibiotic.

## 2014-09-23 ENCOUNTER — Emergency Department (INDEPENDENT_AMBULATORY_CARE_PROVIDER_SITE_OTHER)
Admission: EM | Admit: 2014-09-23 | Discharge: 2014-09-23 | Disposition: A | Discharge status: Home / Self Care | Payer: BLUE CROSS/BLUE SHIELD | Source: Home / Self Care | Attending: Family Medicine | Admitting: Family Medicine

## 2014-09-23 ENCOUNTER — Encounter: Payer: Self-pay | Admitting: Emergency Medicine

## 2014-09-23 DIAGNOSIS — E11621 Type 2 diabetes mellitus with foot ulcer: Secondary | ICD-10-CM | POA: Diagnosis not present

## 2014-09-23 DIAGNOSIS — L03031 Cellulitis of right toe: Secondary | ICD-10-CM

## 2014-09-23 DIAGNOSIS — L97519 Non-pressure chronic ulcer of other part of right foot with unspecified severity: Secondary | ICD-10-CM

## 2014-09-23 NOTE — ED Notes (Signed)
Here for follow up on right toe ulceration. States only sore at night after working all day; then minimal discomfort. Feels the healing process making progress.

## 2014-09-23 NOTE — Discharge Instructions (Signed)
Leave today's dressing on for two days and then replace.  Return in five days for dressing change.  Continue antibiotic.

## 2014-09-23 NOTE — ED Provider Notes (Signed)
CSN: [id number]     Arrival date & time 09/23/14  1334 History   First MD Initiated Contact with Patient 09/23/14 1351     Chief Complaint  Patient presents with  . Wound Check      HPI Comments: Patient returns for dressing change and follow-up of right second toe cellulitis and diabetic ulcer.  He has no complaints.    The history is provided by the patient.    Past Medical History  Diagnosis Date  . Diabetes mellitus   . Hypertension   . Thyroid disease   . Low testosterone   . Gout    Past Surgical History  Procedure Laterality Date  . Knee arthroscopy  2004   History reviewed. No pertinent family history. History  Substance Use Topics  . Smoking status: Never Smoker   . Smokeless tobacco: Current User    Types: Snuff  . Alcohol Use: Yes     Comment: occasionally    Review of Systems Patient reports resolution of pain.  No fevers, chills, and sweats. Allergies  Review of patient's allergies indicates no known allergies.  Home Medications   Prior to Admission medications   Medication Sig Start Date End Date Taking? Authorizing Provider  amLODipine (NORVASC) 10 MG tablet Take 10 mg by mouth daily.    Historical Provider, MD  amoxicillin (AMOXIL) 875 MG tablet Take 1 tablet (875 mg total) by mouth 2 (two) times daily. 09/18/14   Lattie Haw, MD  doxycycline (VIBRAMYCIN) 100 MG capsule Take 1 capsule (100 mg total) by mouth 2 (two) times daily. Take with food. 09/14/14   Lattie Haw, MD  HYDROcodone-acetaminophen (NORCO/VICODIN) 5-325 MG per tablet Take 1 tablet by mouth every 6 (six) hours as needed for moderate pain. 09/18/14   Lattie Haw, MD  imipramine (TOFRANIL) 50 MG tablet Take 50 mg by mouth at bedtime.     Historical Provider, MD  insulin aspart (NOVOLOG) 100 UNIT/ML injection Inject 45 Units into the skin 3 (three) times daily before meals.     Historical Provider, MD  insulin detemir (LEVEMIR) 100 UNIT/ML injection Inject into the skin at bedtime.     Historical Provider, MD  levothyroxine (SYNTHROID, LEVOTHROID) 150 MCG tablet Take 150 mcg by mouth daily.    Historical Provider, MD  Liraglutide (VICTOZA [place]) Inject into the skin every morning.     Historical Provider, MD  losartan (COZAAR) 100 MG tablet Take 100 mg by mouth daily.    Historical Provider, MD  lovastatin (MEVACOR) 20 MG tablet Take 20 mg by mouth.    Historical Provider, MD  metFORMIN (GLUCOPHAGE) 1000 MG tablet Take 1,000 mg by mouth 2 (two) times daily with a meal.    Historical Provider, MD  metoprolol tartrate (LOPRESSOR) 25 MG tablet Take 1 tablet (25 mg total) by mouth 2 (two) times daily. 11/02/11 09/14/14  Joseph Art, DO  Rivaroxaban (XARELTO) 20 MG TABS Take 1 tablet (20 mg total) by mouth daily with breakfast. 11/02/11   Joseph Art, DO  testosterone enanthate (DELATESTRYL) 200 MG/ML injection Inject 200 mg into the muscle every 14 (fourteen) days. For IM use only    Historical Provider, MD   BP 145/75 mmHg  Pulse 71  Temp(Src) 97.5 F (36.4 C) (Oral)  Resp 16  SpO2 96% Physical Exam Nursing notes and Vital Signs reviewed. Appearance:  Patient appears comfortable and in no acute distress Right second toe:  Minimal erythema.  Some necrotic skin remains plantar surface.  Ulcer on volar surface has good granulation tissue and no purulent exudate. Skin:  No rash present.   ED Course  Procedures  With sterile technique, gently debrided a small amount of necrotic skin from right second toe.  Lavaged toe with saline.  Applied Mepelex Lite dressing and secured with Kerlix gauze followed by light wrap of CoBan.   MDM   1. Cellulitis of second toe, right; significantly improved   2. Diabetic ulcer of right foot associated with type 2 diabetes mellitus     Leave today's dressing on for two days and then replace.  Return in five days for dressing change (may return earlier if desired).  Continue antibiotic. Will refer to wound care center for continued management of  ulcer on plantar surface of toe.    Lattie HawStephen A Tkai Large, MD 09/23/14 [phone]

## 2014-09-28 ENCOUNTER — Encounter: Payer: Self-pay | Admitting: Emergency Medicine

## 2014-09-28 ENCOUNTER — Emergency Department (INDEPENDENT_AMBULATORY_CARE_PROVIDER_SITE_OTHER)
Admission: EM | Admit: 2014-09-28 | Discharge: 2014-09-28 | Disposition: A | Discharge status: Home / Self Care | Payer: BLUE CROSS/BLUE SHIELD | Source: Home / Self Care | Attending: Family Medicine | Admitting: Family Medicine

## 2014-09-28 DIAGNOSIS — Z4800 Encounter for change or removal of nonsurgical wound dressing: Secondary | ICD-10-CM

## 2014-09-28 MED ORDER — AMOXICILLIN 875 MG PO TABS: 875.0000 mg | ORAL_TABLET | Freq: Two times a day (BID) | ORAL | Status: AC

## 2014-09-28 NOTE — Discharge Instructions (Signed)
Continue amoxicillin.  Leave present dressing in place for two days, and change on Wednesday 09/30/14.

## 2014-09-28 NOTE — ED Notes (Signed)
Patient here for follow up on ulceration on right 2nd toe.

## 2014-09-28 NOTE — ED Provider Notes (Signed)
CSN: [id number]     Arrival date & time 09/28/14  1642 History   First MD Initiated Contact with Patient 09/28/14 1654     Chief Complaint  Patient presents with  . Wound Check      HPI Comments: Patient returns for follow-up of cellulitis and ulcer right second toe.  He has no complaints.  He changed dressing three days ago and noted small amount of drainage.  The history is provided by the patient.    Past Medical History  Diagnosis Date  . Diabetes mellitus   . Hypertension   . Thyroid disease   . Low testosterone   . Gout    Past Surgical History  Procedure Laterality Date  . Knee arthroscopy  2004   History reviewed. No pertinent family history. History  Substance Use Topics  . Smoking status: Never Smoker   . Smokeless tobacco: Current User    Types: Snuff  . Alcohol Use: Yes     Comment: occasionally    Review of Systems No fevers, chills, and sweats.  No right foot or toe pain.  Allergies  Review of patient's allergies indicates no known allergies.  Home Medications   Prior to Admission medications   Medication Sig Start Date End Date Taking? Authorizing Provider  amLODipine (NORVASC) 10 MG tablet Take 10 mg by mouth daily.    Historical Provider, MD  amoxicillin (AMOXIL) 875 MG tablet Take 1 tablet (875 mg total) by mouth 2 (two) times daily. 09/28/14   Lattie Haw, MD  doxycycline (VIBRAMYCIN) 100 MG capsule Take 1 capsule (100 mg total) by mouth 2 (two) times daily. Take with food. 09/14/14   Lattie Haw, MD  HYDROcodone-acetaminophen (NORCO/VICODIN) 5-325 MG per tablet Take 1 tablet by mouth every 6 (six) hours as needed for moderate pain. 09/18/14   Lattie Haw, MD  imipramine (TOFRANIL) 50 MG tablet Take 50 mg by mouth at bedtime.     Historical Provider, MD  insulin aspart (NOVOLOG) 100 UNIT/ML injection Inject 45 Units into the skin 3 (three) times daily before meals.     Historical Provider, MD  insulin detemir (LEVEMIR) 100 UNIT/ML injection  Inject into the skin at bedtime.    Historical Provider, MD  levothyroxine (SYNTHROID, LEVOTHROID) 150 MCG tablet Take 150 mcg by mouth daily.    Historical Provider, MD  Liraglutide (VICTOZA ) Inject into the skin every morning.     Historical Provider, MD  losartan (COZAAR) 100 MG tablet Take 100 mg by mouth daily.    Historical Provider, MD  lovastatin (MEVACOR) 20 MG tablet Take 20 mg by mouth.    Historical Provider, MD  metFORMIN (GLUCOPHAGE) 1000 MG tablet Take 1,000 mg by mouth 2 (two) times daily with a meal.    Historical Provider, MD  metoprolol tartrate (LOPRESSOR) 25 MG tablet Take 1 tablet (25 mg total) by mouth 2 (two) times daily. 11/02/11 09/14/14  Joseph Art, DO  Rivaroxaban (XARELTO) 20 MG TABS Take 1 tablet (20 mg total) by mouth daily with breakfast. 11/02/11   Joseph Art, DO  testosterone enanthate (DELATESTRYL) 200 MG/ML injection Inject 200 mg into the muscle every 14 (fourteen) days. For IM use only    Historical Provider, MD   BP 145/72 mmHg  Pulse 75  Temp(Src) 98.2 F (36.8 C) (Oral)  Resp 18  Ht  (1.905 m)  Wt 290 lb (131.543 kg)  BMI 36.25 kg/m2  SpO2 95% Physical Exam Patient appears comfortable and  in no distress. Right second toe:  Minimal erythema.  No swelling and no drainage present.  Ulcer on plantar surface now shows healthy viable granulation tissue now level with surrounding skin.  Rinsed toe/wound with sterile water.  Applied Mepelex Border dressing (one-half of 7.5 by 7.5cm piece) followed by Kerlix gauze and Coban. ED Course  Procedures  none  MDM   1. Dressing change or removal, nonsurgical wound.  Cellulitis appears resolved.  Wound healing well.    Rx for amoxicillin 875mg  BID. Leave present dressing in place for two days, and change on Wednesday 09/30/14.  Followup with wound care clinic as scheduled on 10/02/14    Lattie HawStephen A Beese, MD 09/28/14 [phone]

## 2015-02-03 ENCOUNTER — Ambulatory Visit
Admission: RE | Admit: 2015-02-03 | Discharge: 2015-02-03 | Disposition: A | Discharge status: Home / Self Care | Payer: BLUE CROSS/BLUE SHIELD | Source: Ambulatory Visit | Attending: Family Medicine | Admitting: Family Medicine

## 2015-02-03 ENCOUNTER — Other Ambulatory Visit: Payer: Self-pay | Admitting: Family Medicine

## 2015-02-03 DIAGNOSIS — R52 Pain, unspecified: Secondary | ICD-10-CM

## 2017-07-18 IMAGING — CR DG FOOT COMPLETE 3+V*R*
3 series · 3 of 3 positions shown · non-contrast
Comparison: Toe radiograph 09/14/2014

CLINICAL DATA: Patient with pain and swelling at the first
metatarsal. Status post second toe removal 10/14/2014.

EXAM:
RIGHT FOOT COMPLETE - 3+ VIEW

[view not recorded (1 of 3)]
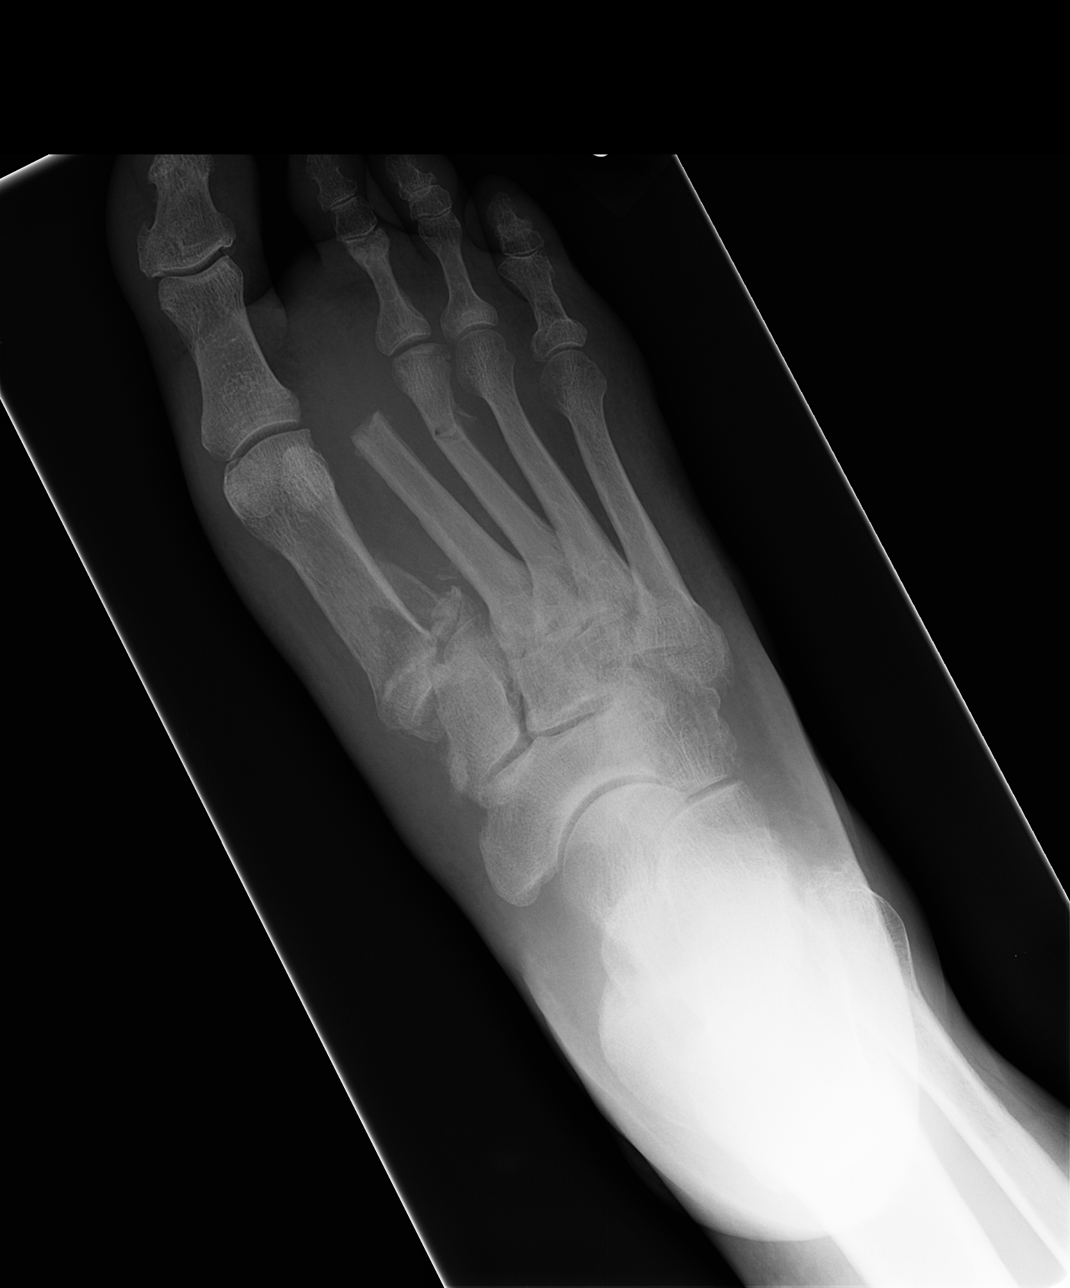

[view not recorded (2 of 3)]
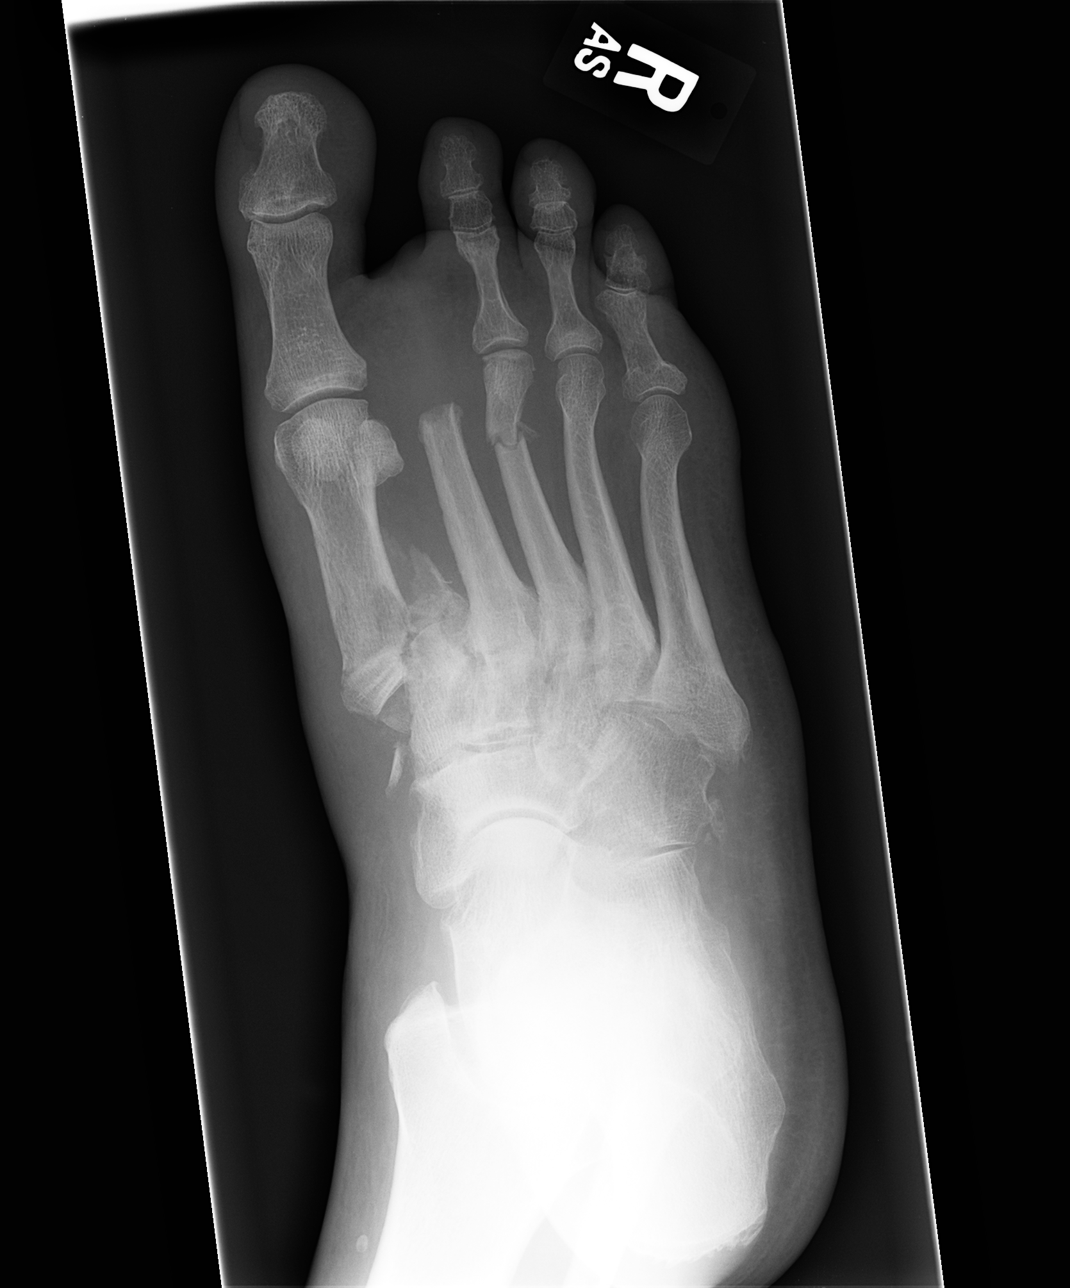

[view not recorded (3 of 3)]
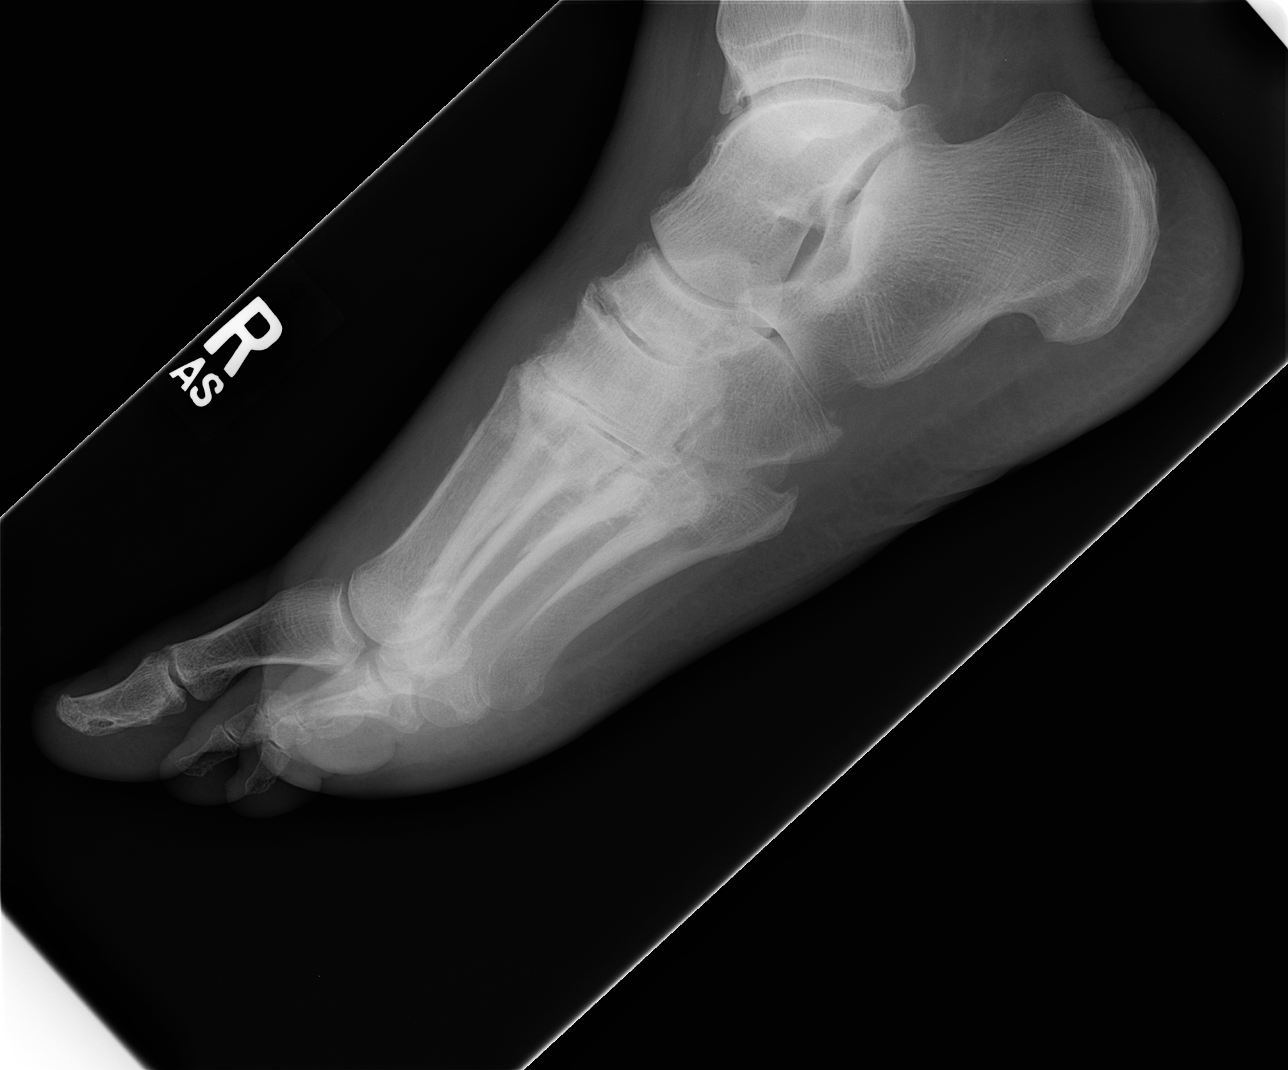

[3 of 3 positions shown; findings below may reference images not displayed]

FINDINGS: There is a comminuted displaced fracture at proximal aspect of the
first metatarsal. Additionally there is a nondisplaced oblique
fracture through the distal diaphysis of the third metatarsal.
Extensive midfoot degenerative changes. Overlying soft tissue
swelling. Status post amputation of the distal second toe.
IMPRESSION: Comminuted displaced fracture of the proximal first metatarsal.

Oblique fracture through the distal diaphysis of the third
metatarsal.

These results will be called to the ordering clinician or
representative by the Radiologist Assistant, and communication
documented in the PACS or zVision Dashboard.

## 2023-10-31 ENCOUNTER — Other Ambulatory Visit: Payer: Self-pay | Admitting: Medical Genetics

## 2023-11-20 ENCOUNTER — Other Ambulatory Visit (HOSPITAL_COMMUNITY)
Admission: RE | Admit: 2023-11-20 | Discharge: 2023-11-20 | Disposition: A | Discharge status: Home / Self Care | Payer: Self-pay | Source: Ambulatory Visit | Attending: Medical Genetics | Admitting: Medical Genetics

## 2023-11-27 LAB — GENECONNECT MOLECULAR SCREEN: Genetic Analysis Overall Interpretation: NEGATIVE
